# Patient Record
Sex: Female | Born: 1975 | Race: Asian | Hispanic: No | Marital: Married | Smoking: Never smoker
Health system: Southern US, Community
[De-identification: ages and names within clinical notes are randomized; demographics above are authoritative.]

## PROBLEM LIST (undated history)

## (undated) DIAGNOSIS — E785 Hyperlipidemia, unspecified: Secondary | ICD-10-CM

## (undated) DIAGNOSIS — I639 Cerebral infarction, unspecified: Secondary | ICD-10-CM

## (undated) DIAGNOSIS — J309 Allergic rhinitis, unspecified: Secondary | ICD-10-CM

## (undated) DIAGNOSIS — J069 Acute upper respiratory infection, unspecified: Secondary | ICD-10-CM

## (undated) DIAGNOSIS — T7840XA Allergy, unspecified, initial encounter: Secondary | ICD-10-CM

## (undated) DIAGNOSIS — I10 Essential (primary) hypertension: Secondary | ICD-10-CM

## (undated) HISTORY — DX: Acute upper respiratory infection, unspecified: J06.9

## (undated) HISTORY — DX: Allergy, unspecified, initial encounter: T78.40XA

## (undated) HISTORY — PX: TONSILLECTOMY: SUR1361

## (undated) HISTORY — DX: Hyperlipidemia, unspecified: E78.5

## (undated) HISTORY — DX: Essential (primary) hypertension: I10

## (undated) HISTORY — PX: ADENOIDECTOMY: SUR15

## (undated) HISTORY — DX: Allergic rhinitis, unspecified: J30.9

## (undated) HISTORY — DX: Cerebral infarction, unspecified: I63.9

## (undated) HISTORY — PX: BREAST ENHANCEMENT SURGERY: SHX7

---

## 1998-10-26 ENCOUNTER — Other Ambulatory Visit: Admission: RE | Admit: 1998-10-26 | Discharge: 1998-10-26 | Payer: Self-pay | Admitting: Obstetrics

## 1999-05-15 ENCOUNTER — Encounter: Payer: Self-pay | Admitting: *Deleted

## 1999-05-15 ENCOUNTER — Ambulatory Visit (HOSPITAL_COMMUNITY): Admission: RE | Admit: 1999-05-15 | Discharge: 1999-05-15 | Payer: Self-pay | Admitting: *Deleted

## 1999-05-24 ENCOUNTER — Other Ambulatory Visit: Admission: RE | Admit: 1999-05-24 | Discharge: 1999-05-24 | Payer: Self-pay | Admitting: Obstetrics

## 1999-12-26 ENCOUNTER — Inpatient Hospital Stay (HOSPITAL_COMMUNITY): Admission: AD | Admit: 1999-12-26 | Discharge: 1999-12-29 | Payer: Self-pay | Admitting: Obstetrics

## 2000-05-29 ENCOUNTER — Other Ambulatory Visit: Admission: RE | Admit: 2000-05-29 | Discharge: 2000-05-29 | Payer: Self-pay | Admitting: Otolaryngology

## 2003-02-27 ENCOUNTER — Inpatient Hospital Stay (HOSPITAL_COMMUNITY): Admission: AD | Admit: 2003-02-27 | Discharge: 2003-03-01 | Payer: Self-pay | Admitting: Obstetrics

## 2004-10-15 ENCOUNTER — Other Ambulatory Visit: Admission: RE | Admit: 2004-10-15 | Discharge: 2004-10-15 | Payer: Self-pay | Admitting: Obstetrics and Gynecology

## 2014-03-20 ENCOUNTER — Ambulatory Visit (INDEPENDENT_AMBULATORY_CARE_PROVIDER_SITE_OTHER): Payer: 59 | Admitting: Internal Medicine

## 2014-03-20 VITALS — BP 154/92 | HR 76 | Temp 97.9°F | Resp 19 | Ht 60.5 in | Wt 104.6 lb

## 2014-03-20 DIAGNOSIS — J0100 Acute maxillary sinusitis, unspecified: Secondary | ICD-10-CM

## 2014-03-20 DIAGNOSIS — J302 Other seasonal allergic rhinitis: Secondary | ICD-10-CM

## 2014-03-20 MED ORDER — AMOXICILLIN 875 MG PO TABS: 875.0000 mg | ORAL_TABLET | Freq: Two times a day (BID) | ORAL | Status: DC

## 2014-03-20 MED ORDER — MONTELUKAST SODIUM 10 MG PO TABS: 10.0000 mg | ORAL_TABLET | Freq: Every day | ORAL | Status: DC

## 2014-03-20 MED ORDER — FLUTICASONE PROPIONATE 50 MCG/ACT NA SUSP: NASAL | Status: DC

## 2014-03-20 MED ORDER — HYDROCODONE-HOMATROPINE 5-1.5 MG/5ML PO SYRP: 5.0000 mL | ORAL_SOLUTION | Freq: Four times a day (QID) | ORAL | Status: DC | PRN

## 2014-03-20 NOTE — Progress Notes (Signed)
Subjective:  This chart was scribed for Ellamae Siaobert Saber Dickerman, MD by Haywood PaoNadim Abu Hashem, ED Scribe at [facility].The patient was seen in exam room 03 and the patient's care was started at 10:35 AM.   Patient ID: Carol Sanders, female    DOB: 1976/01/11, 39 y.o.   MRN: [id number] Chief Complaint  Patient presents with  . Nasal Congestion    for 3 months, has taken various OTC medications bnut no relief   . Cough    non stop cough, productive, clear phlegm some blood in the am  . Sore Throat   HPI HPI Comments: Carol [employer] is a 39 y.o. female with a history of allergies who [facility] complaining of nasal congestion ongoing for 3 months. She has a cough, sore throat, sinus pressure, rhinorrhea and trouble sleeping as associated symptoms. The sore throat is described as itching. She takes Zyrtec and Claritin, and several other OTC medications for no relief. Pt reports she has winter seasonal allergies every year. She works in a the [employer]. She denies fever and no Hx of asthma.  There are no active problems to display for this patient.  History reviewed. No pertinent past medical history. History reviewed. No pertinent past surgical history. Allergies  Allergen Reactions  . Benadryl [Diphenhydramine Hcl (Sleep)] Cough   Prior to Admission medications   Not on File   History   Social History  . Marital Status: Married    Spouse Name: N/A    Number of Children: N/A  . Years of Education: N/A   Occupational History  . Not on file.   Social History Main Topics  . Smoking status: Never Smoker   . Smokeless tobacco: Never Used  . Alcohol Use: No  . Drug Use: No  . Sexual Activity: Not on file   Other Topics Concern  . Not on file   Social History Narrative  . No narrative on file   Review of Systems  Constitutional: Negative for fever.  HENT: Positive for congestion, rhinorrhea, sinus pressure and sore throat.   Respiratory: Positive for cough.     Allergic/Immunologic: Positive for environmental allergies.  Psychiatric/Behavioral: Positive for sleep disturbance.       Objective:  BP 154/92 mmHg  Pulse 76  Temp(Src) 97.9 F (36.6 C) (Oral)  Resp 19  Ht 5' 0.5" (1.537 m)  Wt 104 lb 9.6 oz (47.446 kg)  BMI 20.08 kg/m2  SpO2 99%  LMP 02/28/2014 (Approximate)  Physical Exam  Constitutional: She is oriented to person, place, and time. She appears well-developed and well-nourished. No distress.  HENT:  Head: Normocephalic and atraumatic.  Right Ear: External ear normal.  Left Ear: External ear normal.  Mouth/Throat: Oropharynx is clear and moist.  She has swollen turbinates with purulent mucous.   Eyes: Pupils are equal, round, and reactive to light.  Injected conjunctiva   Neck: Normal range of motion.  Cardiovascular: Normal rate, regular rhythm and normal heart sounds.   Pulmonary/Chest: Effort normal and breath sounds normal. No respiratory distress.  Musculoskeletal: Normal range of motion.  Lymphadenopathy:    She has no cervical adenopathy.  Neurological: She is alert and oriented to person, place, and time.  Skin: Skin is warm and dry.  Psychiatric: She has a normal mood and affect. Her behavior is normal.  Nursing note and vitals reviewed.      Assessment & Plan:  Other seasonal allergic rhinitis  Acute maxillary sinusitis, recurrence not specified  Meds  ordered this encounter  Medications  . amoxicillin (AMOXIL) 875 MG tablet    Sig: Take 1 tablet (875 mg total) by mouth 2 (two) times daily.    Dispense:  20 tablet    Refill:  0  . montelukast (SINGULAIR) 10 MG tablet    Sig: Take 1 tablet (10 mg total) by mouth at bedtime.    Dispense:  30 tablet    Refill:  11  . fluticasone (FLONASE) 50 MCG/ACT nasal spray    Sig: 1 spray in each nostril twice a day. Remain on this medicine year around    Dispense:  16 g    Refill:  6  . HYDROcodone-homatropine (HYCODAN) 5-1.5 MG/5ML syrup    Sig: Take 5 mLs  by mouth every 6 (six) hours as needed.    Dispense:  120 mL    Refill:  0   Patient Instructions  Your significant allergies have caused a sinus infection which should respond to antibiotics. Cough medicine will help you sleep for the next week. In order to stop your allergies you should continue Zyrtec or Claritin but we will add year-round treatment with Singulair and Flonase nasal spray to stop your problems.       I have completed the patient encounter in its entirety as documented by the scribe, with editing by me where necessary. Layah Skousen P. Merla Riches, M.D.

## 2014-03-20 NOTE — Patient Instructions (Signed)
Your significant allergies have caused a sinus infection which should respond to antibiotics. Cough medicine will help you sleep for the next week. In order to stop your allergies you should continue Zyrtec or Claritin but we will add year-round treatment with Singulair and Flonase nasal spray to stop your problems.

## 2014-04-25 ENCOUNTER — Ambulatory Visit (INDEPENDENT_AMBULATORY_CARE_PROVIDER_SITE_OTHER): Payer: 59 | Admitting: Family Medicine

## 2014-04-25 ENCOUNTER — Ambulatory Visit (INDEPENDENT_AMBULATORY_CARE_PROVIDER_SITE_OTHER): Payer: 59

## 2014-04-25 VITALS — BP 118/80 | HR 102 | Temp 98.3°F | Resp 18 | Wt 106.0 lb

## 2014-04-25 DIAGNOSIS — J309 Allergic rhinitis, unspecified: Secondary | ICD-10-CM

## 2014-04-25 DIAGNOSIS — R05 Cough: Secondary | ICD-10-CM | POA: Diagnosis not present

## 2014-04-25 DIAGNOSIS — R053 Chronic cough: Secondary | ICD-10-CM

## 2014-04-25 LAB — POCT CBC
Granulocyte percent: 63.6 %G (ref 37–80)
HCT, POC: 38.6 % (ref 37.7–47.9)
Hemoglobin: 12 g/dL — AB (ref 12.2–16.2)
Lymph, poc: 1.6 (ref 0.6–3.4)
MCH, POC: 23 pg — AB (ref 27–31.2)
MCHC: 31 g/dL — AB (ref 31.8–35.4)
MCV: 74.3 fL — AB (ref 80–97)
MID (cbc): 0.7 (ref 0–0.9)
MPV: 7.1 fL (ref 0–99.8)
PLATELET COUNT, POC: 308 10*3/uL (ref 142–424)
POC Granulocyte: 3.9 (ref 2–6.9)
POC LYMPH %: 25.6 % (ref 10–50)
POC MID %: 10.8 %M (ref 0–12)
RBC: 5.2 M/uL (ref 4.04–5.48)
RDW, POC: 16.4 %
WBC: 6.1 10*3/uL (ref 4.6–10.2)

## 2014-04-25 MED ORDER — BENZONATATE 100 MG PO CAPS: 100.0000 mg | ORAL_CAPSULE | Freq: Three times a day (TID) | ORAL | Status: DC | PRN

## 2014-04-25 MED ORDER — AZITHROMYCIN 250 MG PO TABS: ORAL_TABLET | ORAL | Status: DC

## 2014-04-25 NOTE — Patient Instructions (Signed)
Take tessalon for cough. Take antibiotic until finished. You will get phone call to make appointment with allergy specialist. Continue to take allergy meds every day - claritin, flonase and singulair.

## 2014-04-25 NOTE — Progress Notes (Signed)
Subjective:    Patient ID: Carol Sanders, female    DOB: 1975-06-17, 39 y.o.   MRN: [id number]  HPI  This is a 39 year old female with PMH allergic rhinitis who is presenting with cough and nasal congestion x 4.5 months. She was seen here 1.5 months ago and diagnosed with sinus infection caused by uncontrolled allergic rhinitis. She was prescribed amoxicillin, hycodan syrup, singulair and flonase. She was instructed to start taking daily clartin. She states since that visit her symptoms have not changed. She continues to cough constantly - cough is productive of white mucous. She has had a few episodes of post-tussive emesis. She continues to have constant nasal congestion. She has been compliant with allergy medications. 2 days ago she started having myalgias, fatigue and subjective fevers. She is endorsing new bilateral ear pain. She denies sore throat, SOB, wheezing. She does not have a history of asthma and is not a smoker. She has never seen an allergist before.  Review of Systems  Constitutional: Positive for fever and fatigue. Negative for chills.  HENT: Positive for congestion, ear pain and sinus pressure. Negative for sore throat.   Eyes: Negative for pain and redness.  Respiratory: Positive for cough. Negative for shortness of breath and wheezing.   Gastrointestinal: Positive for vomiting. Negative for nausea, abdominal pain and diarrhea.  Musculoskeletal: Positive for myalgias.  Skin: Negative for rash.  Allergic/Immunologic: Positive for environmental allergies.  Hematological: Negative for adenopathy.    Patient Active Problem List   Diagnosis Date Noted  . Other seasonal allergic rhinitis 03/20/2014   Prior to Admission medications   Medication Sig Start Date End Date Taking? Authorizing Provider  fluticasone (FLONASE) 50 MCG/ACT nasal spray 1 spray in each nostril twice a day. Remain on this medicine year around 03/20/14  Yes Tonye Pearsonobert P Doolittle, MD  montelukast (SINGULAIR) 10  MG tablet Take 1 tablet (10 mg total) by mouth at bedtime. 03/20/14  Yes Tonye Pearsonobert P Doolittle, MD   Allergies  Allergen Reactions  . Benadryl [Diphenhydramine Hcl (Sleep)] Cough   Patient's social and family history were reviewed.     Objective:   Physical Exam  Constitutional: She is oriented to person, place, and time. She appears well-developed and well-nourished. No distress.  HENT:  Head: Normocephalic and atraumatic.  Right Ear: Hearing, tympanic membrane, external ear and ear canal normal.  Left Ear: Hearing, tympanic membrane, external ear and ear canal normal.  Nose: Right sinus exhibits no maxillary sinus tenderness and no frontal sinus tenderness. Left sinus exhibits no maxillary sinus tenderness and no frontal sinus tenderness.  Mouth/Throat: Uvula is midline and mucous membranes are normal. Posterior oropharyngeal erythema present. No oropharyngeal exudate or posterior oropharyngeal edema.  Pale mucosa  Eyes: Conjunctivae and lids are normal. Right eye exhibits no discharge. Left eye exhibits no discharge. No scleral icterus.  Cardiovascular: Normal rate, regular rhythm, normal heart sounds, intact distal pulses and normal pulses.   No murmur heard. Pulmonary/Chest: Effort normal and breath sounds normal. No respiratory distress. She has no wheezes. She has no rhonchi. She has no rales.  Abdominal: Soft. Normal appearance. There is no tenderness.  Musculoskeletal: Normal range of motion.  Lymphadenopathy:    She has cervical adenopathy (bilateral anterior, shotty).  Neurological: She is alert and oriented to person, place, and time.  Skin: Skin is warm, dry and intact. No lesion and no rash noted.  Psychiatric: She has a normal mood and affect. Her speech is normal and behavior is normal.  Thought content normal.   BP 118/80 mmHg  Pulse 102  Temp(Src) 98.3 F (36.8 C) (Oral)  Resp 18  Wt 106 lb (48.081 kg)  SpO2 94%  LMP 03/23/2014  [facility] reading (PRIMARY) by  Dr.  Clelia Croft: possible right hilar consolidation  Results for orders placed or performed in visit on 04/25/14  POCT CBC  Result Value Ref Range   WBC 6.1 4.6 - 10.2 K/uL   Lymph, poc 1.6 0.6 - 3.4   POC LYMPH PERCENT 25.6 10 - 50 %L   MID (cbc) 0.7 0 - 0.9   POC MID % 10.8 0 - 12 %M   POC Granulocyte 3.9 2 - 6.9   Granulocyte percent 63.6 37 - 80 %G   RBC 5.20 4.04 - 5.48 M/uL   Hemoglobin 12.0 (A) 12.2 - 16.2 g/dL   HCT, POC 16.1 09.6 - 47.9 %   MCV 74.3 (A) 80 - 97 fL   MCH, POC 23.0 (A) 27 - 31.2 pg   MCHC 31.0 (A) 31.8 - 35.4 g/dL   RDW, POC 04.5 %   Platelet Count, POC 308 142 - 424 K/uL   MPV 7.1 0 - 99.8 fL       Assessment & Plan:  1. Persistent cough 2. Allergic rhinitis, unspecified Radiograph negative, cbc wnl. Will treat with azithromycin d/t ongoing symptoms with new myalgias and malaise. She will continue with claritin, flonase and singulair. She was referred to an allergist for further evaluation of uncontrolled allergies.  - DG Chest 2 View; Future - POCT CBC - azithromycin (ZITHROMAX) 250 MG tablet; Take 2 tabs PO x 1 dose, then 1 tab PO QD x 4 days  Dispense: 6 tablet; Refill: 0 - benzonatate (TESSALON) 100 MG capsule; Take 1-2 capsules (100-200 mg total) by mouth 3 (three) times daily as needed for cough.  Dispense: 40 capsule; Refill: 0 - Ambulatory referral to Allergy   Roswell Miners. Dyke Brackett, MHS [facility]  04/26/2014

## 2014-04-28 ENCOUNTER — Other Ambulatory Visit: Payer: Self-pay | Admitting: Physician Assistant

## 2014-05-10 NOTE — Progress Notes (Signed)
Reviewed documentation and xray and agree w/ assessment and plan. Norberto SorensonEva Shaw, MD MPH  Dg Chest 2 View  04/25/2014   CLINICAL DATA:  Cough for 1 and half months  EXAM: CHEST  2 VIEW  COMPARISON:  None.  FINDINGS: The heart size and mediastinal contours are within normal limits. There is no focal infiltrate, pulmonary edema, or pleural effusion. Bilateral symmetrical nipple shadows are noted. There is scoliosis of spine.  IMPRESSION: No active cardiopulmonary disease.   Electronically Signed   By: Sherian ReinWei-Chen  Lin M.D.   On: 04/25/2014 20:48

## 2016-04-17 ENCOUNTER — Ambulatory Visit (INDEPENDENT_AMBULATORY_CARE_PROVIDER_SITE_OTHER): Payer: BLUE CROSS/BLUE SHIELD | Admitting: Allergy and Immunology

## 2016-04-17 ENCOUNTER — Encounter (INDEPENDENT_AMBULATORY_CARE_PROVIDER_SITE_OTHER): Payer: Self-pay

## 2016-04-17 ENCOUNTER — Encounter: Payer: Self-pay | Admitting: Allergy and Immunology

## 2016-04-17 VITALS — BP 136/88 | HR 86 | Temp 98.2°F | Resp 16 | Ht 60.0 in | Wt 110.0 lb

## 2016-04-17 DIAGNOSIS — H1010 Acute atopic conjunctivitis, unspecified eye: Secondary | ICD-10-CM | POA: Diagnosis not present

## 2016-04-17 DIAGNOSIS — J309 Allergic rhinitis, unspecified: Secondary | ICD-10-CM

## 2016-04-17 DIAGNOSIS — K219 Gastro-esophageal reflux disease without esophagitis: Secondary | ICD-10-CM | POA: Diagnosis not present

## 2016-04-17 LAB — PULMONARY FUNCTION TEST

## 2016-04-17 MED ORDER — MOMETASONE FUROATE 0.1 % EX OINT: TOPICAL_OINTMENT | CUTANEOUS | 3 refills | Status: DC

## 2016-04-17 MED ORDER — METHYLPREDNISOLONE ACETATE 80 MG/ML IJ SUSP
80.0000 mg | Freq: Once | INTRAMUSCULAR | Status: AC
  Administered 2016-04-17: 80 mg via INTRAMUSCULAR

## 2016-04-17 MED ORDER — LOSARTAN POTASSIUM 50 MG PO TABS: 50.0000 mg | ORAL_TABLET | Freq: Every day | ORAL | 1 refills | Status: DC

## 2016-04-17 MED ORDER — RANITIDINE HCL 150 MG PO TABS: 300.0000 mg | ORAL_TABLET | Freq: Every day | ORAL | 1 refills | Status: DC

## 2016-04-17 NOTE — Patient Instructions (Addendum)
  1. Allergen avoidance measures  2. Treat and prevent inflammation:   A. OTC Rhinocort/Nasacort one spray each nostril one time per day  B. montelukast 10 mg tablet 1 time per day  C. Depo-Medrol 80 IM delivered in clinic today  3.  Treat and prevent reflux:   A. OTC Ranitidine 150 - 2 tablets one time per day  4.  Treat hypertension:   A. change lisinopril to losartan 50 one tablet one time per day  B. check blood pressure  5.  If needed:   A. Cetirizine/Zyrtec one tablet one-2 times per day  B. nasal saline   C. mometasone 0.1% ointment applied to hands one time per day  6. Immunotherapy?  7. Return to clinic in 4 weeks or earlier if problem

## 2016-04-17 NOTE — Progress Notes (Signed)
Dear Dr. Egbert Garibaldi,  Thank you for referring Carol Sanders to the [facility] [place] on 04/17/2016.   Below is a summation of this patient's evaluation and recommendations.  Thank you for your referral. I will keep you informed about this patient's response to treatment.   If you have any questions please do not hesitate to contact me.   Sincerely,  Jessica Priest, MD Allergy / Immunology [facility]   ______________________________________________________________________    NEW PATIENT NOTE  Referring Provider: Nonnie Done., MD Primary Provider: Nonnie Done., MD Date of office visit: 04/17/2016    Subjective:   Chief Complaint:  Carol Sanders (DOB: January 26, 1976) is a 41 y.o. female who presents to the clinic on 04/17/2016 with a chief complaint of Allergic Rhinitis  (enovironmental. year round. itchy throat/eyes. coughing. no antihistamines x3 days. ) .     HPI: Taunia presents to this clinic in evaluation of persistent respiratory tract symptoms that have been present for approximately 10 years.  She complains of having runny nose and sneezing and occasional itchy eyes. She also has constant postnasal drip and throat clearing and itchy throat and raspy voice and coughing. She will regurgitate sometimes with some of her coughing episodes. She will develop a headache sometimes when she coughs excessively. She does not have any associated anosmia or ugly nasal discharge. There is no obvious provoking factor giving rise to these symptoms. They appear to occur on a perennial basis but are somewhat worse when the summertime season is present.   Her coughing has really gotten out of hand the past several months. She complains of having this chest pressure in her sternal region on occasion with her coughing. The coughing is disturbing her sleep.  She is using lisinopril to treat hypertension.  She  has seen Dr. Ezzard Standing on several occasions many years ago and apparently her upper airway checks out okay. She does not know if her throat was evaluated.  She has also developed problems with her hands. They become somewhat itchy over the course of the past several years.  Past Medical History:  Diagnosis Date  . Allergic rhinitis   . Recurrent upper respiratory infection (URI)     Past Surgical History:  Procedure Laterality Date  . ADENOIDECTOMY    . TONSILLECTOMY      Allergies as of 04/17/2016      Reactions   Benadryl [diphenhydramine Hcl (sleep)] Cough      Medication List      ALKA-SELTZER PLUS COLD PO Take 1 packet by mouth daily as needed.   fluticasone 50 MCG/ACT nasal spray Commonly known as:  FLONASE 1 spray in each nostril twice a day. Remain on this medicine year around   lisinopril 10 MG tablet Commonly known as:  PRINIVIL,ZESTRIL TK 1 T PO ONCE A DAY   loratadine 10 MG tablet Commonly known as:  CLARITIN Take 10 mg by mouth daily as needed for allergies.   loratadine-pseudoephedrine 10-240 MG 24 hr tablet Commonly known as:  CLARITIN-D 24-hour Take 1 tablet by mouth daily.   montelukast 10 MG tablet Commonly known as:  SINGULAIR Take 1 tablet (10 mg total) by mouth at bedtime.   PENICILLIN V POTASSIUM PO Take 1 tablet by mouth. Takes prn for excess nasal drainage.   phenylephrine 10 MG Tabs tablet Commonly known as:  SUDAFED PE Take 10 mg by mouth as needed.  Review of systems negative except as noted in HPI / PMHx or noted below:  Review of Systems  Constitutional: Negative.   HENT: Negative.   Eyes: Negative.   Respiratory: Negative.   Cardiovascular: Negative.   Gastrointestinal: Negative.   Genitourinary: Negative.   Musculoskeletal: Negative.   Skin: Negative.   Neurological: Negative.   Endo/Heme/Allergies: Negative.   Psychiatric/Behavioral: Negative.     No family history on file.  Social History   Social History    . Marital status: Married    Spouse name: N/A  . Number of children: N/A  . Years of education: N/A   Occupational History  . Not on file.   Social History Main Topics  . Smoking status: Never Smoker  . Smokeless tobacco: Never Used  . Alcohol use No  . Drug use: No  . Sexual activity: Not on file   Other Topics Concern  . Not on file   Social History Narrative  . No narrative on file    Environmental and Social history  Lives in a house with a dry environment, no animals located inside the household, hardwood in the bedroom, no plastic on the bed or pillow, and no smoking ongoing with inside the household. She works as a Advertising account planner.  Objective:   Vitals:   04/17/16 0915  BP: 136/88  Pulse: 86  Resp: 16  Temp: 98.2 F (36.8 C)   Height: 5' (152.4 cm) Weight: 110 lb (49.9 kg)  Physical Exam  Constitutional: She is well-developed, well-nourished, and in no distress.  Cough, throat clearing, raspy voice  HENT:  Head: Normocephalic. Head is without right periorbital erythema and without left periorbital erythema.  Right Ear: Tympanic membrane, external ear and ear canal normal.  Left Ear: Tympanic membrane, external ear and ear canal normal.  Nose: Mucosal edema and rhinorrhea (clear) present.  Mouth/Throat: Uvula is midline, oropharynx is clear and moist and mucous membranes are normal. No oropharyngeal exudate.  Eyes: Conjunctivae and lids are normal. Pupils are equal, round, and reactive to light.  Neck: Trachea normal. No tracheal tenderness present. No tracheal deviation present. No thyromegaly present.  Cardiovascular: Normal rate, regular rhythm, S1 normal, S2 normal and normal heart sounds.   No murmur heard. Pulmonary/Chest: Effort normal and breath sounds normal. No stridor. No tachypnea. No respiratory distress. She has no wheezes. She has no rales. She exhibits no tenderness.  Abdominal: Soft. She exhibits no distension and no mass. There is no  hepatosplenomegaly. There is no tenderness. There is no rebound and no guarding.  Musculoskeletal: She exhibits no edema or tenderness.  Lymphadenopathy:       Head (right side): No tonsillar adenopathy present.       Head (left side): No tonsillar adenopathy present.    She has no cervical adenopathy.    She has no axillary adenopathy.  Neurological: She is alert. Gait normal.  Skin: No rash noted. She is not diaphoretic. No erythema. No pallor. Nails show no clubbing.  Psychiatric: Mood and affect normal.    Diagnostics: Allergy skin tests were performed. She demonstrated hypersensitivity against grasses, weeds, trees, cat, and cockroach with very slight hypersensitivity directed against dog.  Spirometry was performed and demonstrated an FEV1 of 1.89 @ 79 % of predicted.  Assessment and Plan:    1. Allergic rhinoconjunctivitis   2. Other allergic rhinitis   3. Gastroesophageal reflux disease, esophagitis presence not specified     1. Allergen avoidance measures  2. Treat and prevent inflammation:  A. OTC Rhinocort/Nasacort one spray each nostril one time per day  B. montelukast 10 mg tablet 1 time per day  C. Depo-Medrol 80 IM delivered in clinic today  3.  Treat and prevent reflux:   A. OTC Rantidine 150 - 2 tablets one time per day  4.  Treat hypertension:   A. change lisinopril to losartan 50 one tablet one time per day  B. check blood pressure  5.  If needed:   A. Cetirizine/Zyrtec one tablet one-2 times per day  B. nasal saline   C. mometasone 0.1% ointment applied to hands one time per day  6. Immunotherapy?  7. Return to clinic in 4 weeks or earlier if problem  [name] appears to have an inflamed and irritated respiratory tract and we will address the atopic component with anti-inflammatory therapy for her respiratory tract as noted above and remove her lisinopril as she does have pretty consistent cough which may be secondary to the use of this ACE inhibitor  and also address the issue with possible reflux-induced cough especially given the fact that she does have what appears to be posttussive emesis. I will regroup with her in 4 weeks to assess her response to therapy and make a determination about further evaluation and treatment based upon this response.  Jessica PriestEric J. Cline Draheim, MD [facility] [place]

## 2016-04-18 ENCOUNTER — Encounter: Payer: Self-pay | Admitting: Allergy and Immunology

## 2016-04-19 ENCOUNTER — Other Ambulatory Visit: Payer: Self-pay

## 2016-04-19 MED ORDER — MONTELUKAST SODIUM 10 MG PO TABS: 10.0000 mg | ORAL_TABLET | Freq: Every day | ORAL | 11 refills | Status: DC

## 2016-05-02 ENCOUNTER — Encounter: Payer: Self-pay | Admitting: Allergy and Immunology

## 2016-05-15 ENCOUNTER — Ambulatory Visit (INDEPENDENT_AMBULATORY_CARE_PROVIDER_SITE_OTHER): Payer: BLUE CROSS/BLUE SHIELD | Admitting: Allergy and Immunology

## 2016-05-15 ENCOUNTER — Encounter: Payer: Self-pay | Admitting: Allergy and Immunology

## 2016-05-15 ENCOUNTER — Ambulatory Visit
Admission: RE | Admit: 2016-05-15 | Discharge: 2016-05-15 | Disposition: A | Discharge status: Home / Self Care | Payer: BLUE CROSS/BLUE SHIELD | Source: Ambulatory Visit | Attending: Allergy and Immunology | Admitting: Allergy and Immunology

## 2016-05-15 VITALS — BP 130/90 | HR 60 | Resp 18

## 2016-05-15 DIAGNOSIS — R05 Cough: Secondary | ICD-10-CM | POA: Diagnosis not present

## 2016-05-15 DIAGNOSIS — R059 Cough, unspecified: Secondary | ICD-10-CM

## 2016-05-15 DIAGNOSIS — J309 Allergic rhinitis, unspecified: Secondary | ICD-10-CM

## 2016-05-15 DIAGNOSIS — H1010 Acute atopic conjunctivitis, unspecified eye: Secondary | ICD-10-CM | POA: Diagnosis not present

## 2016-05-15 DIAGNOSIS — K219 Gastro-esophageal reflux disease without esophagitis: Secondary | ICD-10-CM

## 2016-05-15 LAB — PULMONARY FUNCTION TEST

## 2016-05-15 MED ORDER — RANITIDINE HCL 150 MG PO TABS: ORAL_TABLET | ORAL | 5 refills | Status: DC

## 2016-05-15 MED ORDER — OMEPRAZOLE 40 MG PO CPDR: DELAYED_RELEASE_CAPSULE | ORAL | 5 refills | Status: DC

## 2016-05-15 NOTE — Progress Notes (Signed)
Follow-up Note  Referring Provider: Nonnie Done., MD Primary Provider: Nonnie Done., MD Date of Office Visit: 05/15/2016  Subjective:   Carol Sanders (DOB: [birth year]) is a 41 y.o. female who [facility] in re-evaluation of the following:  HPI: Carol Sanders returns to this clinic in reevaluation of her respiratory tract symptoms addressed during her initial evaluation of 04/17/2016.  She is much better regarding her cough. This is the first time in a prolonged period of time that she has resolved her cough. However, she still wakes up in the morning and has lots of throat clearing and some raspy voice. She has had very little problems with her nose at this point in time. She is no longer using her lisinopril. She continues to use ranitidine and Nasacort and montelukast and losartan.  Her hands are doing well. She's been using mometasone intermittently.  Allergies as of 05/15/2016      Reactions   Benadryl [diphenhydramine Hcl (sleep)] Cough      Medication List      ALKA-SELTZER PLUS COLD PO Take 1 packet by mouth daily as needed.   fluticasone 50 MCG/ACT nasal spray Commonly known as:  FLONASE 1 spray in each nostril twice a day. Remain on this medicine year around   loratadine 10 MG tablet Commonly known as:  CLARITIN Take 10 mg by mouth daily as needed for allergies.   loratadine-pseudoephedrine 10-240 MG 24 hr tablet Commonly known as:  CLARITIN-D 24-hour Take 1 tablet by mouth daily.   losartan 50 MG tablet Commonly known as:  COZAAR Take 1 tablet (50 mg total) by mouth daily.   mometasone 0.1 % ointment Commonly known as:  ELOCON Apply to hands once daily as needed.   montelukast 10 MG tablet Commonly known as:  SINGULAIR Take 1 tablet (10 mg total) by mouth at bedtime.   PENICILLIN V POTASSIUM PO Take 1 tablet by mouth. Takes prn for excess nasal drainage.   phenylephrine 10 MG Tabs tablet Commonly known as:   SUDAFED PE Take 10 mg by mouth as needed.   ranitidine 150 MG tablet Commonly known as:  ZANTAC Take 2 tablets (300 mg total) by mouth daily.       Past Medical History:  Diagnosis Date  . Allergic rhinitis   . Recurrent upper respiratory infection (URI)     Past Surgical History:  Procedure Laterality Date  . ADENOIDECTOMY    . TONSILLECTOMY      Review of systems negative except as noted in HPI / PMHx or noted below:  Review of Systems  Constitutional: Negative.   HENT: Negative.   Eyes: Negative.   Respiratory: Negative.   Cardiovascular: Negative.   Gastrointestinal: Negative.   Genitourinary: Negative.   Musculoskeletal: Negative.   Skin: Negative.   Neurological: Negative.   Endo/Heme/Allergies: Negative.   Psychiatric/Behavioral: Negative.      Objective:   Vitals:   05/15/16 1004  BP: 130/90  Pulse: 60  Resp: 18          Physical Exam  Constitutional: She is well-developed, well-nourished, and in no distress.  Throat clearing  HENT:  Head: Normocephalic.  Right Ear: Tympanic membrane, external ear and ear canal normal.  Left Ear: Tympanic membrane, external ear and ear canal normal.  Nose: Nose normal. No mucosal edema or rhinorrhea.  Mouth/Throat: Uvula is midline, oropharynx is clear and moist and mucous membranes are normal. No oropharyngeal exudate.  Eyes: Conjunctivae are  normal.  Neck: Trachea normal. No tracheal tenderness present. No tracheal deviation present. No thyromegaly present.  Cardiovascular: Normal rate, regular rhythm, S1 normal, S2 normal and normal heart sounds.   No murmur heard. Pulmonary/Chest: Breath sounds normal. No stridor. No respiratory distress. She has no wheezes. She has no rales.  Musculoskeletal: She exhibits no edema.  Lymphadenopathy:       Head (right side): No tonsillar adenopathy present.       Head (left side): No tonsillar adenopathy present.    She has no cervical adenopathy.  Neurological: She  is alert. Gait normal.  Skin: No rash noted. She is not diaphoretic. No erythema. Nails show no clubbing.  Psychiatric: Mood and affect normal.    Diagnostics:    Spirometry was performed and demonstrated an FEV1 of 1.92 at 79 % of predicted.   Assessment and Plan:   1. Allergic rhinoconjunctivitis   2. Gastroesophageal reflux disease, esophagitis presence not specified   3. Cough     1. Continue to perform Allergen avoidance measures  2. Continue to Treat and prevent inflammation:   A. OTC Rhinocort/Nasacort one spray each nostril one time per day  B. montelukast 10 mg tablet 1 time per day  3.  Continue to Treat and prevent reflux:   A. Continue OTC Ranitidine 150 - 2 tablets one time in PM  B. Start Omeprazole 40mg  in AM  4.  Continue to Treat hypertension:   A. losartan 50 one tablet one time per day  B. check blood pressure  5.  If needed:   A. Cetirizine/Zyrtec one tablet one-2 times per day  B. nasal saline   C. mometasone 0.1% ointment applied to hands one time per day  6. Obtain a Chest X-ray  7. Return to clinic in 8 weeks or earlier if problem  It is good that Carol Sanders has resolved her unrelenting coughing episodes but she still continues to have lots of throat clearing and a little bit of morning cough and I will assume that this may be secondary to LPR and treat her with the addition of omeprazole to her ranitidine at this point and I'm also going to obtain a chest x-ray to make sure that we are not dealing with any other issue involving her mediastinum or lung parenchyma contributing to cough. She has already seen an ENT doctor, Dr. Ezzard StandingNewman, in the past regarding some of her complaints. She will continue to treat her atopic respiratory disease with anti-inflammatory medications for her respiratory tract as noted above. We'll see how things go over the course of the next 8 weeks.  Laurette SchimkeEric Kozlow, MD Allergy / Immunology [facility]

## 2016-05-15 NOTE — Patient Instructions (Addendum)
  1. Continue to perform Allergen avoidance measures  2. Continue to Treat and prevent inflammation:   A. OTC Rhinocort/Nasacort one spray each nostril one time per day  B. montelukast 10 mg tablet 1 time per day  3.  Continue to Treat and prevent reflux:   A. Continue OTC Ranitidine 150 - 2 tablets one time in PM  B. Start Omeprazole 40mg  in AM  4.  Continue to Treat hypertension:   A. losartan 50 one tablet one time per day  B. check blood pressure  5.  If needed:   A. Cetirizine/Zyrtec one tablet one-2 times per day  B. nasal saline   C. mometasone 0.1% ointment applied to hands one time per day  6. Obtain a Chest X-ray  7. Return to clinic in 8 weeks or earlier if problem

## 2016-06-12 ENCOUNTER — Encounter: Payer: Self-pay | Admitting: Allergy and Immunology

## 2016-06-29 ENCOUNTER — Other Ambulatory Visit: Payer: Self-pay | Admitting: Allergy and Immunology

## 2016-07-10 ENCOUNTER — Encounter (INDEPENDENT_AMBULATORY_CARE_PROVIDER_SITE_OTHER): Payer: Self-pay

## 2016-07-10 ENCOUNTER — Ambulatory Visit (INDEPENDENT_AMBULATORY_CARE_PROVIDER_SITE_OTHER): Payer: BLUE CROSS/BLUE SHIELD | Admitting: Allergy and Immunology

## 2016-07-10 ENCOUNTER — Encounter: Payer: Self-pay | Admitting: Allergy and Immunology

## 2016-07-10 VITALS — BP 130/82 | HR 85 | Temp 98.2°F | Resp 18

## 2016-07-10 DIAGNOSIS — K219 Gastro-esophageal reflux disease without esophagitis: Secondary | ICD-10-CM

## 2016-07-10 DIAGNOSIS — H1045 Other chronic allergic conjunctivitis: Secondary | ICD-10-CM

## 2016-07-10 DIAGNOSIS — L2089 Other atopic dermatitis: Secondary | ICD-10-CM | POA: Diagnosis not present

## 2016-07-10 DIAGNOSIS — H1010 Acute atopic conjunctivitis, unspecified eye: Secondary | ICD-10-CM

## 2016-07-10 DIAGNOSIS — J3089 Other allergic rhinitis: Secondary | ICD-10-CM | POA: Diagnosis not present

## 2016-07-10 DIAGNOSIS — T464X5A Adverse effect of angiotensin-converting-enzyme inhibitors, initial encounter: Secondary | ICD-10-CM

## 2016-07-10 DIAGNOSIS — R059 Cough, unspecified: Secondary | ICD-10-CM

## 2016-07-10 DIAGNOSIS — R011 Cardiac murmur, unspecified: Secondary | ICD-10-CM

## 2016-07-10 DIAGNOSIS — R05 Cough: Secondary | ICD-10-CM

## 2016-07-10 NOTE — Progress Notes (Signed)
Follow-up Note  Referring Provider: Nonnie DoneSlatosky, John J., MD Primary Provider: Nonnie DoneSlatosky, John J., MD Date of Office Visit: 07/10/2016  Subjective:   Carol Sanders (DOB: [birth year]) is a 41 y.o. female who [facility]2016 in re-evaluation of the following:  HPI: [name] presents to this clinic in evaluation of cough, throat clearing, intermittent raspy voice, nasal congestion and sneezing associated with her allergic rhinitis, itchy red watery eyes associated with her seasonal allergic conjunctivitis and intermittent atopic dermatitis. I last saw her in this clinic on 05/15/2016 at which point in time we attempted to address all of her issues.  Since the pollen has abated she has had excellent control of her nasal and eye issues. She has stopped her montelukast and her nasal steroid.  She does not have cough anymore since she discontinued her lisinopril and used therapy directed against reflux. Her throat is doing better though sometimes she has early morning raspy voice.  She no longer has any chest pain. She has not had any reflux.  The dermatitis on her hand is under excellent control and she no longer uses any topical steroid.  Allergies as of 07/10/2016      Reactions   Benadryl [diphenhydramine Hcl (sleep)] Cough      Medication List      cetirizine 10 MG tablet Commonly known as:  ZYRTEC Take 10 mg by mouth daily.   fluticasone 50 MCG/ACT nasal spray Commonly known as:  FLONASE 1 spray in each nostril twice a day. Remain on this medicine year around   losartan 50 MG tablet Commonly known as:  COZAAR TAKE 1 TABLET(50 MG) BY MOUTH DAILY   montelukast 10 MG tablet Commonly known as:  SINGULAIR Take 1 tablet (10 mg total) by mouth at bedtime.   omeprazole 40 MG capsule Commonly known as:  PRILOSEC Take one capsule every morning before breakfast   PENICILLIN V POTASSIUM PO Take 1 tablet by mouth. Takes prn for excess nasal  drainage.   phenylephrine 10 MG Tabs tablet Commonly known as:  SUDAFED PE Take 10 mg by mouth daily as needed.   ranitidine 150 MG tablet Commonly known as:  ZANTAC Take two tablets at bedtime       Past Medical History:  Diagnosis Date  . Allergic rhinitis   . Recurrent upper respiratory infection (URI)     Past Surgical History:  Procedure Laterality Date  . ADENOIDECTOMY    . TONSILLECTOMY      Review of systems negative except as noted in HPI / PMHx or noted below:  Review of Systems  Constitutional: Negative.   HENT: Negative.   Eyes: Negative.   Respiratory: Negative.   Cardiovascular: Negative.   Gastrointestinal: Negative.   Genitourinary: Negative.   Musculoskeletal: Negative.   Skin: Negative.   Neurological: Negative.   Endo/Heme/Allergies: Negative.   Psychiatric/Behavioral: Negative.      Objective:   Vitals:   07/10/16 1012  BP: 130/82  Pulse: 85  Resp: 18  Temp: 98.2 F (36.8 C)          Physical Exam  Constitutional: She is well-developed, well-nourished, and in no distress.  HENT:  Head: Normocephalic.  Right Ear: External ear and ear canal normal. Tympanic membrane is perforated.  Left Ear: Tympanic membrane, external ear and ear canal normal.  Nose: Mucosal edema present. No rhinorrhea.  Mouth/Throat: Uvula is midline, oropharynx is clear and moist and mucous membranes are normal. No oropharyngeal exudate.  Eyes: Conjunctivae are normal.  Neck: Trachea normal. No tracheal tenderness present. No tracheal deviation present. No thyromegaly present.  Cardiovascular: Normal rate, regular rhythm, S1 normal and S2 normal.   Murmur (Systolic) heard. Pulmonary/Chest: Breath sounds normal. No stridor. No respiratory distress. She has no wheezes. She has no rales.  Musculoskeletal: She exhibits no edema.  Lymphadenopathy:       Head (right side): No tonsillar adenopathy present.       Head (left side): No tonsillar adenopathy present.      She has no cervical adenopathy.  Neurological: She is alert. Gait normal.  Skin: No rash noted. She is not diaphoretic. No erythema. Nails show no clubbing.  Psychiatric: Mood and affect normal.    Diagnostics: Results of a chest x-ray obtained 05/15/2016 identify the following:  Mediastinum and hilar structures normal. Lungs are clear. No pleural effusion or pneumothorax. Heart size normal.  Assessment and Plan:   1. Other allergic rhinitis   2. Seasonal allergic conjunctivitis   3. Gastroesophageal reflux disease, esophagitis presence not specified   4. Other atopic dermatitis   5. Cough   6. Murmur   7. Cough due to ACE inhibitor     1. Continue to perform Allergen avoidance measures  2. Continue to Treat and prevent inflammation:   A. OTC Nasacort one spray each nostril one time per day during Spring  B. montelukast 10 mg tablet 1 time per day during Spring  3.  Continue to Treat and prevent reflux:   A. OTC Ranitidine 150 - 2 tablets one time in PM  B. Omeprazole 40mg  in AM  4.  Continue to Treat hypertension:   A. losartan 50 one tablet one time per day  B. check blood pressure  5.  If needed:   A. Cetirizine/Zyrtec one tablet one-2 times per day  B. nasal saline   C. mometasone 0.1% ointment applied to hands one time per day  6. Obtain a 2D - Echo for murmur  7. Return to clinic in September 2018 or earlier if problem  Prerana appears to be doing better at this point especially since the pollen has abated. She can always restart her nasal steroid and her leukotriene modifier if she develop significant problems with her eyes and nose as she moves forward. I did recommend that she restart these medications at next Valentine's day in anticipation of her springtime flare. She will continue to treat reflux and LPR with a combination of a H2 receptor blocker and a proton pump inhibitor. She will continue to remain away from ACE inhibitor's by using her losartan. She  does have a murmur and we will evaluate that with a echo. I will see her back in this clinic in September 2018 or earlier if there is a problem.  Laurette Schimke, MD Allergy / Immunology [facility]

## 2016-07-10 NOTE — Patient Instructions (Addendum)
  1. Continue to perform Allergen avoidance measures  2. Continue to Treat and prevent inflammation:   A. OTC Nasacort one spray each nostril one time per day during Spring  B. montelukast 10 mg tablet 1 time per day during Spring  3.  Continue to Treat and prevent reflux:   A. OTC Ranitidine 150 - 2 tablets one time in PM  B. Omeprazole 40mg  in AM  4.  Continue to Treat hypertension:   A. losartan 50 one tablet one time per day  B. check blood pressure  5.  If needed:   A. Cetirizine/Zyrtec one tablet one-2 times per day  B. nasal saline   C. mometasone 0.1% ointment applied to hands one time per day  6. Obtain a 2D - Echo for murmur  7. Return to clinic in September 2018 or earlier if problem

## 2016-07-24 ENCOUNTER — Ambulatory Visit (HOSPITAL_COMMUNITY): Payer: BLUE CROSS/BLUE SHIELD | Attending: Cardiology

## 2016-07-24 ENCOUNTER — Other Ambulatory Visit: Payer: Self-pay

## 2016-07-24 ENCOUNTER — Other Ambulatory Visit: Payer: Self-pay | Admitting: Allergy and Immunology

## 2016-07-24 DIAGNOSIS — R011 Cardiac murmur, unspecified: Secondary | ICD-10-CM | POA: Diagnosis present

## 2016-10-22 ENCOUNTER — Ambulatory Visit: Payer: BLUE CROSS/BLUE SHIELD | Admitting: Allergy and Immunology

## 2016-10-22 DIAGNOSIS — J309 Allergic rhinitis, unspecified: Secondary | ICD-10-CM

## 2016-11-08 ENCOUNTER — Other Ambulatory Visit: Payer: Self-pay | Admitting: Allergy and Immunology

## 2016-12-17 ENCOUNTER — Other Ambulatory Visit: Payer: Self-pay | Admitting: Allergy and Immunology

## 2018-02-12 ENCOUNTER — Emergency Department (HOSPITAL_COMMUNITY): Payer: BLUE CROSS/BLUE SHIELD

## 2018-02-12 ENCOUNTER — Inpatient Hospital Stay (HOSPITAL_COMMUNITY)
Admission: EM | Admit: 2018-02-12 | Discharge: 2018-02-14 | DRG: 066 | Disposition: A | Discharge status: Home / Self Care | Payer: BLUE CROSS/BLUE SHIELD | Attending: Family Medicine | Admitting: Family Medicine

## 2018-02-12 ENCOUNTER — Other Ambulatory Visit: Payer: Self-pay

## 2018-02-12 DIAGNOSIS — R202 Paresthesia of skin: Secondary | ICD-10-CM

## 2018-02-12 DIAGNOSIS — H538 Other visual disturbances: Secondary | ICD-10-CM | POA: Diagnosis present

## 2018-02-12 DIAGNOSIS — D649 Anemia, unspecified: Secondary | ICD-10-CM

## 2018-02-12 DIAGNOSIS — R29700 NIHSS score 0: Secondary | ICD-10-CM | POA: Diagnosis present

## 2018-02-12 DIAGNOSIS — J309 Allergic rhinitis, unspecified: Secondary | ICD-10-CM | POA: Diagnosis present

## 2018-02-12 DIAGNOSIS — Z91138 Patient's unintentional underdosing of medication regimen for other reason: Secondary | ICD-10-CM

## 2018-02-12 DIAGNOSIS — I10 Essential (primary) hypertension: Secondary | ICD-10-CM | POA: Diagnosis present

## 2018-02-12 DIAGNOSIS — D509 Iron deficiency anemia, unspecified: Secondary | ICD-10-CM | POA: Diagnosis present

## 2018-02-12 DIAGNOSIS — I639 Cerebral infarction, unspecified: Secondary | ICD-10-CM | POA: Diagnosis not present

## 2018-02-12 DIAGNOSIS — R200 Anesthesia of skin: Secondary | ICD-10-CM

## 2018-02-12 DIAGNOSIS — E876 Hypokalemia: Secondary | ICD-10-CM | POA: Diagnosis present

## 2018-02-12 DIAGNOSIS — Z888 Allergy status to other drugs, medicaments and biological substances status: Secondary | ICD-10-CM

## 2018-02-12 DIAGNOSIS — E785 Hyperlipidemia, unspecified: Secondary | ICD-10-CM | POA: Diagnosis present

## 2018-02-12 DIAGNOSIS — Z79899 Other long term (current) drug therapy: Secondary | ICD-10-CM

## 2018-02-12 DIAGNOSIS — Z823 Family history of stroke: Secondary | ICD-10-CM

## 2018-02-12 DIAGNOSIS — R9082 White matter disease, unspecified: Secondary | ICD-10-CM | POA: Diagnosis present

## 2018-02-12 DIAGNOSIS — T465X6A Underdosing of other antihypertensive drugs, initial encounter: Secondary | ICD-10-CM | POA: Diagnosis present

## 2018-02-12 DIAGNOSIS — Z8249 Family history of ischemic heart disease and other diseases of the circulatory system: Secondary | ICD-10-CM

## 2018-02-12 LAB — I-STAT CHEM 8, ED
BUN: 12 mg/dL (ref 6–20)
Calcium, Ion: 1 mmol/L — ABNORMAL LOW (ref 1.15–1.40)
Chloride: 104 mmol/L (ref 98–111)
Creatinine, Ser: 0.5 mg/dL (ref 0.44–1.00)
GLUCOSE: 108 mg/dL — AB (ref 70–99)
HCT: 30 % — ABNORMAL LOW (ref 36.0–46.0)
Hemoglobin: 10.2 g/dL — ABNORMAL LOW (ref 12.0–15.0)
Potassium: 3.1 mmol/L — ABNORMAL LOW (ref 3.5–5.1)
Sodium: 141 mmol/L (ref 135–145)
TCO2: 27 mmol/L (ref 22–32)

## 2018-02-12 LAB — I-STAT BETA HCG BLOOD, ED (MC, WL, AP ONLY): I-stat hCG, quantitative: <5 m[IU]/mL (ref <5)

## 2018-02-12 LAB — I-STAT TROPONIN, ED: Troponin i, poc: 0 ng/mL (ref 0.00–0.08)

## 2018-02-12 NOTE — ED Provider Notes (Addendum)
[facility]   CSN: [id number] Arrival date & time: 02/12/18  2249     History   Chief Complaint Chief Complaint  Patient presents with  . Numbness    Numbness in face and left arm    HPI Carol Sanders is a 42 y.o. female.  HPI   42 yo F with PMHx recurrent uRI here with facial and arm numbness. Pt states that she was normal until around 8 PM , when she developed acute onset of left-sided facial, arm, and leg numbness. She also c/o left-sided visual field deficits. She states she has an associated aching, throbbing posterior hA. She has a h/o left-sided numbness in past but this is new and worsened for her. No recent trauma or infectious sx. No dysarthria, dysphagia. No difficulty speaking.  Activated as code stroke on arrival.  Past Medical History:  Diagnosis Date  . Allergic rhinitis   . Recurrent upper respiratory infection (URI)     Patient Active Problem List   Diagnosis Date Noted  . Other seasonal allergic rhinitis 03/20/2014    Past Surgical History:  Procedure Laterality Date  . ADENOIDECTOMY    . TONSILLECTOMY       OB History   No obstetric history on file.      Home Medications    Prior to Admission medications   Medication Sig Start Date End Date Taking? Authorizing Provider  amitriptyline (ELAVIL) 100 MG tablet Take 100 mg by mouth at bedtime.   Yes [provider]  amoxicillin (AMOXIL) 500 MG capsule Take 500 mg by mouth 3 (three) times daily.   Yes [provider]  cetirizine (ZYRTEC) 10 MG tablet Take 10 mg by mouth daily.   Yes [provider]  losartan (COZAAR) 50 MG tablet TAKE 1 TABLET(50 MG) BY MOUTH DAILY Patient taking differently: Take 50 mg by mouth daily.  12/18/16  Yes Kozlow, Alvira PhilipsEric J, MD  omeprazole (PRILOSEC) 40 MG capsule Take one capsule every morning before breakfast Patient taking differently: Take 40 mg by mouth daily.  05/15/16  Yes Kozlow, Alvira PhilipsEric  J, MD  ranitidine (ZANTAC) 150 MG tablet Take two tablets at bedtime Patient taking differently: Take 300 mg by mouth at bedtime. Take two tablets at bedtime 05/15/16  Yes Kozlow, Alvira PhilipsEric J, MD  fluticasone [facility](FLONASE) 50 MCG/ACT nasal spray 1 spray in each nostril twice a day. Remain on this medicine year around Patient not taking: Reported on 07/10/2016 03/20/14   Tonye Pearsonoolittle, Robert P, MD  montelukast (SINGULAIR) 10 MG tablet Take 1 tablet (10 mg total) by mouth at bedtime. Patient not taking: Reported on 02/12/2018 04/19/16   Jessica PriestKozlow, Eric J, MD    Family History No family history on file.  Social History Social History   Tobacco Use  . Smoking status: Never Smoker  . Smokeless tobacco: Never Used  Substance Use Topics  . Alcohol use: No    Alcohol/week: 0.0 standard drinks  . Drug use: No     Allergies   Benadryl [diphenhydramine hcl (sleep)]   Review of Systems Review of Systems  Constitutional: Negative for chills and fever.  HENT: Negative for congestion, rhinorrhea and sore throat.   Eyes: Negative for visual disturbance.  Respiratory: Negative for cough, shortness of breath and wheezing.   Cardiovascular: Negative for chest pain and leg swelling.  Gastrointestinal: Negative for abdominal pain, diarrhea, nausea and vomiting.  Genitourinary: Negative for dysuria, flank pain, vaginal bleeding and vaginal discharge.  Musculoskeletal: Negative  for neck pain.  Skin: Negative for rash.  Allergic/Immunologic: Negative for immunocompromised state.  Neurological: Positive for facial asymmetry, weakness and numbness. Negative for syncope and headaches.  Hematological: Does not bruise/bleed easily.  All other systems reviewed and are negative.    Physical Exam Updated Vital Signs BP 118/87   Pulse 71   Resp 18   Ht 5\' 3"  (1.6 m)   Wt 49.4 kg   SpO2 98%   BMI 19.31 kg/m   Physical Exam Vitals signs and nursing note reviewed.  Constitutional:      General: She is not in  acute distress.    Appearance: She is well-developed.  HENT:     Head: Normocephalic and atraumatic.  Eyes:     Conjunctiva/sclera: Conjunctivae normal.  Neck:     Musculoskeletal: Neck supple.  Cardiovascular:     Rate and Rhythm: Normal rate and regular rhythm.     Heart sounds: Normal heart sounds. No murmur. No friction rub.  Pulmonary:     Effort: Pulmonary effort is normal. No respiratory distress.     Breath sounds: Normal breath sounds. No wheezing or rales.  Abdominal:     General: There is no distension.     Palpations: Abdomen is soft.     Tenderness: There is no abdominal tenderness.  Skin:    General: Skin is warm.     Capillary Refill: Capillary refill takes less than 2 seconds.  Neurological:     Mental Status: She is alert and oriented to person, place, and time.     Motor: No abnormal muscle tone.     Neurological Exam:  Mental Status: Alert and oriented to person, place, and time. Attention and concentration normal. Speech clear. Recent memory is intact. Cranial Nerves: Visual fields grossly intact. EOMI and PERRLA. No nystagmus noted. Facial sensation diminished at left forehead, maxillary cheek, and chin/mandible bilaterally. Subtle L NLF. Hearing grossly normal. Uvula is midline, and palate elevates symmetrically. Normal SCM and trapezius strength. Tongue midline without fasciculations. Motor: Muscle strength 5/5 in proximal and distal UE and LE bilaterally. No pronator drift. Muscle tone normal. Reflexes: 2+ and symmetrical in all four extremities.  Sensation: Intact to light touch in upper and lower extremities distally bilaterally though subjectively diminished LUE and LLE. Gait: Normal without ataxia. Coordination: Normal FTN bilaterally.    ED Treatments / Results  Labs (all labs ordered are listed, but only abnormal results are displayed) Labs Reviewed  CBC - Abnormal; Notable for the following components:      Result Value   Hemoglobin 7.8 (*)      HCT 28.2 (*)    MCV 61.0 (*)    MCH 16.9 (*)    MCHC 27.7 (*)    RDW 21.2 (*)    All other components within normal limits  DIFFERENTIAL - Abnormal; Notable for the following components:   Eosinophils Absolute 1.0 (*)    All other components within normal limits  COMPREHENSIVE METABOLIC PANEL - Abnormal; Notable for the following components:   Potassium 3.1 (*)    Glucose, Bld 108 (*)    Calcium 8.1 (*)    AST 14 (*)    All other components within normal limits  URINALYSIS, ROUTINE W REFLEX MICROSCOPIC - Abnormal; Notable for the following components:   Color, Urine STRAW (*)    Hgb urine dipstick SMALL (*)    All other components within normal limits  I-STAT CHEM 8, ED - Abnormal; Notable for the following components:  Potassium 3.1 (*)    Glucose, Bld 108 (*)    Calcium, Ion 1.00 (*)    Hemoglobin 10.2 (*)    HCT 30.0 (*)    All other components within normal limits  ETHANOL  PROTIME-INR  APTT  RAPID URINE DRUG SCREEN, HOSP PERFORMED  I-STAT TROPONIN, ED  I-STAT BETA HCG BLOOD, ED ([facility], [facility], [facility] ONLY)    EKG None  Radiology Mr Maxine Glenn Head Wo Contrast  Result Date: 02/13/2018 CLINICAL DATA:  LEFT facial numbness radiating to LEFT arm tonight. EXAM: MRI HEAD WITHOUT CONTRAST MRA HEAD WITHOUT CONTRAST TECHNIQUE: Multiplanar, multiecho pulse sequences of the brain and surrounding structures were obtained without intravenous contrast. Angiographic images of the head were obtained using MRA technique without contrast. Axial T2 FLAIR, axial susceptibility weighted imaging, coronal T2 and axial T1 sequences not obtained. Patient did not wish to finish examination. COMPARISON:  CT HEAD February 12, 2018. FINDINGS: MRI HEAD FINDINGS INTRACRANIAL CONTENTS: 12 mm reduced diffusion RIGHT thalamus to cerebral peduncle with low ADC values patchy frontal parietal subcortical white matter T2 hyperintensities. The ventricles and sulci are normal for patient's age. No suspicious parenchymal  signal, masses, mass effect. No abnormal extra-axial fluid collections. No extra-axial masses. VASCULAR: Normal major intracranial vascular flow voids present at skull base. SKULL AND UPPER CERVICAL SPINE: No abnormal sellar expansion. Included pituitary. No suspicious calvarial bone marrow signal. Craniocervical junction maintained. SINUSES/ORBITS: Mild ethmoid sinusitis. The included ocular globes and orbital contents are non-suspicious. OTHER: None. MRA HEAD FINDINGS-moderately motion degraded images through the circle-of-Willis. ANTERIOR CIRCULATION: Normal flow related enhancement of the included cervical, petrous, cavernous and supraclinoid internal carotid arteries. Patent anterior communicating artery. Patent anterior and middle cerebral arteries. POSTERIOR CIRCULATION: Codominant vertebral arteries. Vertebrobasilar arteries are patent, with normal flow related enhancement of the main branch vessels. Patent posterior cerebral arteries. Robust RIGHT posterior communicating artery present. ANATOMIC VARIANTS: None. Source images and MIP images were reviewed. IMPRESSION: MRI HEAD: 1. 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 2. Patchy bifrontal parietal subcortical white matter T2 hyperintensities seen with vasculopathy, Lyme disease, less likely TBI or chronic small vessel ischemic changes. Recommend contrast enhanced images and/or 3 to six-month follow-up to establish stability of findings. MRA HEAD: 1. Motion degraded examination without emergent large vessel occlusion. Limited assessment of circle-of-Willis. 2. To assess for stenosis or vasculitis, CTA HEAD could be performed. Critical Value/emergent results text paged to Dr.ERIC [facility] via AMION secure system on 02/13/2018 at 2:32 am, including interpreting physician's phone number. Electronically Signed   By: Awilda Metro M.D.   On: 02/13/2018 02:32   Mr Brain Wo Contrast  Result Date:  02/13/2018 CLINICAL DATA:  LEFT facial numbness radiating to LEFT arm tonight. EXAM: MRI HEAD WITHOUT CONTRAST MRA HEAD WITHOUT CONTRAST TECHNIQUE: Multiplanar, multiecho pulse sequences of the brain and surrounding structures were obtained without intravenous contrast. Angiographic images of the head were obtained using MRA technique without contrast. Axial T2 FLAIR, axial susceptibility weighted imaging, coronal T2 and axial T1 sequences not obtained. Patient did not wish to finish examination. COMPARISON:  CT HEAD February 12, 2018. FINDINGS: MRI HEAD FINDINGS INTRACRANIAL CONTENTS: 12 mm reduced diffusion RIGHT thalamus to cerebral peduncle with low ADC values patchy frontal parietal subcortical white matter T2 hyperintensities. The ventricles and sulci are normal for patient's age. No suspicious parenchymal signal, masses, mass effect. No abnormal extra-axial fluid collections. No extra-axial masses. VASCULAR: Normal major intracranial vascular flow voids present at skull base.  SKULL AND UPPER CERVICAL SPINE: No abnormal sellar expansion. Included pituitary. No suspicious calvarial bone marrow signal. Craniocervical junction maintained. SINUSES/ORBITS: Mild ethmoid sinusitis. The included ocular globes and orbital contents are non-suspicious. OTHER: None. MRA HEAD FINDINGS-moderately motion degraded images through the circle-of-Willis. ANTERIOR CIRCULATION: Normal flow related enhancement of the included cervical, petrous, cavernous and supraclinoid internal carotid arteries. Patent anterior communicating artery. Patent anterior and middle cerebral arteries. POSTERIOR CIRCULATION: Codominant vertebral arteries. Vertebrobasilar arteries are patent, with normal flow related enhancement of the main branch vessels. Patent posterior cerebral arteries. Robust RIGHT posterior communicating artery present. ANATOMIC VARIANTS: None. Source images and MIP images were reviewed. IMPRESSION: MRI HEAD: 1. Limited 4 sequence  MRI head: Acute 12 mm RIGHT thalamus to cerebral peduncle nonhemorrhagic infarct. 2. Patchy bifrontal parietal subcortical white matter T2 hyperintensities seen with vasculopathy, Lyme disease, less likely TBI or chronic small vessel ischemic changes. Recommend contrast enhanced images and/or 3 to six-month follow-up to establish stability of findings. MRA HEAD: 1. Motion degraded examination without emergent large vessel occlusion. Limited assessment of circle-of-Willis. 2. To assess for stenosis or vasculitis, CTA HEAD could be performed. Critical Value/emergent results text paged to Dr.ERIC [facility] via AMION secure system on 02/13/2018 at 2:32 am, including interpreting physician's phone number. Electronically Signed   By: Awilda Metro M.D.   On: 02/13/2018 02:32   Ct Head Code Stroke Wo Contrast  Result Date: 02/12/2018 CLINICAL DATA:  Code stroke. LEFT arm and tongue numbness. Last seen normal at 8 a.m. EXAM: CT HEAD WITHOUT CONTRAST TECHNIQUE: Contiguous axial images were obtained from the base of the skull through the vertex without intravenous contrast. COMPARISON:  None. FINDINGS: BRAIN: No intraparenchymal hemorrhage, mass effect nor midline shift. Faint bifrontal subcortical white matter hypodensities. The ventricles and sulci are normal. No acute large vascular territory infarcts. No abnormal extra-axial fluid collections. Basal cisterns are patent. VASCULAR: Unremarkable. SKULL/SOFT TISSUES: No skull fracture. No significant soft tissue swelling. ORBITS/SINUSES: The included ocular globes and orbital contents are normal.Mild lobulated paranasal sinus mucosal thickening. Mastoid air cells are well aerated. OTHER: None. ASPECTS [facility] Stroke Program Early CT Score) - Ganglionic level infarction (caudate, lentiform nuclei, internal capsule, insula, M1-M3 cortex): 7 - Supraganglionic infarction (M4-M6 cortex): 3 Total score (0-10 with 10 being normal): 10 IMPRESSION: 1. No acute intracranial  process. 2. ASPECTS is 10. 3. Bifrontal white matter changes seen with old shear injury, chronic small vessel ischemic changes or artifact. Findings would better characterized on MRI of the brain with and without contrast. 4. Critical Value/emergent results text paged to Dr.Lindzen, Neurology via AMION secure system on 02/12/2018 at 11:34 pm, including interpreting physician's phone number. Electronically Signed   By: Awilda Metro M.D.   On: 02/12/2018 23:34    Procedures .Critical Care Performed by: Shaune Pollack, MD Authorized by: Shaune Pollack, MD   Critical care provider statement:    Critical care time (minutes):  35   Critical care time was exclusive of:  Separately billable procedures and treating other patients and teaching time   Critical care was necessary to treat or prevent imminent or life-threatening deterioration of the following conditions:  CNS failure or compromise   Critical care was time spent personally by me on the following activities:  Development of treatment plan with patient or surrogate, discussions with consultants, evaluation of patient's response to treatment, examination of patient, obtaining history from patient or surrogate, ordering and performing treatments and interventions, ordering and review of laboratory studies, ordering and review of radiographic studies,  pulse oximetry, re-evaluation of patient's condition and review of old charts   I assumed direction of critical care for this patient from another provider in my specialty: no     (including critical care time)  Medications Ordered in ED Medications  aspirin chewable tablet 81 mg (has no administration in time range)  prochlorperazine (COMPAZINE) injection 10 mg (10 mg Intravenous Given 02/13/18 0056)  aspirin EC tablet 650 mg (650 mg Oral Given 02/13/18 0340)     Initial Impression / Assessment and Plan / ED Course  I have reviewed the triage vital signs and the nursing  notes.  Pertinent labs & imaging results that were available during my care of the patient were reviewed by me and considered in my medical decision making (see chart for details).     42 yo F here w/ left-sided numbness, including face. Activated as code stroke on arrival. Pt also with HA some given compazine for possible migraine component. MRI obtained per Neuro and shows acute thalamic stroke, possible underlying vasculopathy. Of note, Hgb also 7.8 - will need further work-up and tx as inpt. No h/o CAD, no CP, no SOB, hold on transfusion at this time.  Final Clinical Impressions(s) / ED Diagnoses   Final diagnoses:  Cerebrovascular accident (CVA), unspecified mechanism (HCC)  Anemia, unspecified type    ED Discharge Orders    None       Shaune Pollack, MD 02/13/18 0405    Shaune Pollack, MD 02/23/18 [phone]

## 2018-02-12 NOTE — ED Notes (Addendum)
Last known normal approx. 8 pm tonight

## 2018-02-12 NOTE — Consult Note (Signed)
NEURO HOSPITALIST CONSULT NOTE   Requestig physician: Dr. Rush Landmarkegeler  Reason for Consult: Left face and arm sensory numbness  History obtained from:   Patient and Chart    HPI:                                                                                                                                          Carol Sanders is an 42 y.o. female who presented to the ED via EMS with c/c of left sided facial and arm numbness beginning at 8:00 PM. She stated that she has a history of intermittent left sided numbness, but that it has never extended to her face. She also complained of left sided visual blurring. Code Stroke was called and STAT CT head was ordered. Neurological exam in CT was nonfocal, with no visual field cut, motor or sensory deficit noted. She remained symptomatic with perception of left sided face and arm numbness despite symmetric temp and FT sensation.   Past Medical History:  Diagnosis Date  . Allergic rhinitis   . Recurrent upper respiratory infection (URI)     Past Surgical History:  Procedure Laterality Date  . ADENOIDECTOMY    . TONSILLECTOMY      No family history on file.            Social History:  reports that she has never smoked. She has never used smokeless tobacco. She reports that she does not drink alcohol or use drugs.  Allergies  Allergen Reactions  . Benadryl [Diphenhydramine Hcl (Sleep)] Cough    HOME MEDICATIONS:                                                                                                                        ROS:  As per HPI. No other complaints.    Blood pressure (!) 149/101, pulse 77, resp. rate 18, height 5\' 3"  (1.6 m), weight 49.4 kg, SpO2 94 %.   General Examination:                                                                                                        Physical Exam  HEENT-  [place]/AT    Lungs- Respirations unlabored Extremities- No edema  Neurological Examination Mental Status:  Alert, oriented, thought content appropriate.  Speech fluent without evidence of aphasia.  Able to follow all commands without difficulty. Anxious affect.  Cranial Nerves: II:  Visual fields intact with no extinction to DSS.  III,IV, VI: EOMI with saccadic quality of visual pursuits noted. No ptosis. No nystagmus.  V,VII: No facial droop. Facial temp sensation equal bilaterally VIII: hearing intact to voice IX,X: No hypophonia or hoarseness XI: Symmetric XII: midline tongue extension Motor: Right : Upper extremity   5/5    Left:     Upper extremity   5/5  Lower extremity   5/5     Lower extremity   5/5 Normal tone throughout; no atrophy noted No pronator drift.  Sensory: Temp and light touch intact proximally in all 4 extremities. No extinction.  Deep Tendon Reflexes: 2+ and symmetric throughout Plantars: Right: downgoing   Left: downgoing Cerebellar: No ataxia with FNF or H-S bilaterally  Gait: Deferred   Lab Results: Basic Metabolic Panel: No results for input(s): NA, K, CL, CO2, GLUCOSE, BUN, CREATININE, CALCIUM, MG, PHOS in the last 168 hours.  CBC: No results for input(s): WBC, NEUTROABS, HGB, HCT, MCV, PLT in the last 168 hours.  Cardiac Enzymes: No results for input(s): CKTOTAL, CKMB, CKMBINDEX, TROPONINI in the last 168 hours.  Lipid Panel: No results for input(s): CHOL, TRIG, HDL, CHOLHDL, VLDL, LDLCALC in the last 168 hours.  Imaging: Ct Head Code Stroke Wo Contrast  Result Date: 02/12/2018 CLINICAL DATA:  Code stroke. LEFT arm and tongue numbness. Last seen normal at 8 a.m. EXAM: CT HEAD WITHOUT CONTRAST TECHNIQUE: Contiguous axial images were obtained from the base of the skull through the vertex without intravenous contrast. COMPARISON:  None. FINDINGS: BRAIN: No intraparenchymal hemorrhage, mass effect nor midline shift. Faint  bifrontal subcortical white matter hypodensities. The ventricles and sulci are normal. No acute large vascular territory infarcts. No abnormal extra-axial fluid collections. Basal cisterns are patent. VASCULAR: Unremarkable. SKULL/SOFT TISSUES: No skull fracture. No significant soft tissue swelling. ORBITS/SINUSES: The included ocular globes and orbital contents are normal.Mild lobulated paranasal sinus mucosal thickening. Mastoid air cells are well aerated. OTHER: None. ASPECTS [facility] Stroke Program Early CT Score) - Ganglionic level infarction (caudate, lentiform nuclei, internal capsule, insula, M1-M3 cortex): 7 - Supraganglionic infarction (M4-M6 cortex): 3 Total score (0-10 with 10 being normal): 10 IMPRESSION: 1. No acute intracranial process. 2. ASPECTS is 10. 3. Bifrontal white matter changes seen with old shear injury, chronic small vessel ischemic changes or artifact. Findings would better characterized on MRI of the brain with and without contrast. 4. Critical Value/emergent results  text paged to Dr.Alean Kromer, Neurology via [employer]ION secure system on 02/12/2018 at 11:34 pm, including interpreting physician's phone number. Electronically Signed   By: Awilda Metro M.D.   On: 02/12/2018 23:34    Assessment: 42 year old female with acute onset of subjective left face and LUE sensory numbness with left sided visual blurring beginning at 8 PM.  1. Neurological exam is nonfocal, with no visual field cut, motor or sensory deficit noted. She remains symptomatic with perception of left sided face and arm numbness despite symmetric temp and FT sensation.  2. CT head shows no acute intracranial process. Bifrontal white matter changes are present, which may be seen with old shear injury, chronic small vessel ischemic changes or artifact.  3. IV tPA not indicated due to NIHSS of 0  Recommendations: 1. STAT MRI/MRA of brain.  2. Further neurological recommendations pending MRI results.   Electronically  signed: Dr. Caryl Pina 02/12/2018, 11:38 PM

## 2018-02-12 NOTE — ED Triage Notes (Signed)
EMS coming by EMS with complaints of left sided facial numbness that extends down left arm that started about 40 minutes ago at home. Pt has stated she has had intermittent numbness of left side by never extending in to her face. Notes that her left sided vision is blurry. No stroke history. No facial drooping noted.

## 2018-02-12 NOTE — ED Notes (Signed)
Code stroke initiated by MD.

## 2018-02-12 NOTE — ED Notes (Signed)
Pt was transported to CT where Neurologist assessed pt. After CT it was decided to discontinue code stroke

## 2018-02-13 ENCOUNTER — Inpatient Hospital Stay (HOSPITAL_COMMUNITY): Payer: BLUE CROSS/BLUE SHIELD

## 2018-02-13 ENCOUNTER — Other Ambulatory Visit (HOSPITAL_COMMUNITY): Payer: BLUE CROSS/BLUE SHIELD

## 2018-02-13 ENCOUNTER — Emergency Department (HOSPITAL_COMMUNITY): Payer: BLUE CROSS/BLUE SHIELD

## 2018-02-13 ENCOUNTER — Encounter (HOSPITAL_COMMUNITY): Payer: Self-pay | Admitting: Internal Medicine

## 2018-02-13 DIAGNOSIS — Z79899 Other long term (current) drug therapy: Secondary | ICD-10-CM | POA: Diagnosis not present

## 2018-02-13 DIAGNOSIS — H538 Other visual disturbances: Secondary | ICD-10-CM | POA: Diagnosis present

## 2018-02-13 DIAGNOSIS — I639 Cerebral infarction, unspecified: Principal | ICD-10-CM

## 2018-02-13 DIAGNOSIS — D649 Anemia, unspecified: Secondary | ICD-10-CM

## 2018-02-13 DIAGNOSIS — J309 Allergic rhinitis, unspecified: Secondary | ICD-10-CM | POA: Diagnosis present

## 2018-02-13 DIAGNOSIS — I10 Essential (primary) hypertension: Secondary | ICD-10-CM | POA: Diagnosis not present

## 2018-02-13 DIAGNOSIS — R200 Anesthesia of skin: Secondary | ICD-10-CM | POA: Diagnosis present

## 2018-02-13 DIAGNOSIS — R9082 White matter disease, unspecified: Secondary | ICD-10-CM | POA: Diagnosis not present

## 2018-02-13 DIAGNOSIS — T465X6A Underdosing of other antihypertensive drugs, initial encounter: Secondary | ICD-10-CM | POA: Diagnosis present

## 2018-02-13 DIAGNOSIS — E876 Hypokalemia: Secondary | ICD-10-CM | POA: Diagnosis present

## 2018-02-13 DIAGNOSIS — Z823 Family history of stroke: Secondary | ICD-10-CM | POA: Diagnosis not present

## 2018-02-13 DIAGNOSIS — I6389 Other cerebral infarction: Secondary | ICD-10-CM | POA: Diagnosis not present

## 2018-02-13 DIAGNOSIS — R29700 NIHSS score 0: Secondary | ICD-10-CM | POA: Diagnosis present

## 2018-02-13 DIAGNOSIS — Z888 Allergy status to other drugs, medicaments and biological substances status: Secondary | ICD-10-CM | POA: Diagnosis not present

## 2018-02-13 DIAGNOSIS — D509 Iron deficiency anemia, unspecified: Secondary | ICD-10-CM | POA: Diagnosis present

## 2018-02-13 DIAGNOSIS — Z91138 Patient's unintentional underdosing of medication regimen for other reason: Secondary | ICD-10-CM | POA: Diagnosis not present

## 2018-02-13 DIAGNOSIS — E785 Hyperlipidemia, unspecified: Secondary | ICD-10-CM | POA: Diagnosis present

## 2018-02-13 DIAGNOSIS — Z8249 Family history of ischemic heart disease and other diseases of the circulatory system: Secondary | ICD-10-CM | POA: Diagnosis not present

## 2018-02-13 LAB — URINALYSIS, ROUTINE W REFLEX MICROSCOPIC
Bacteria, UA: NONE SEEN
Bilirubin Urine: NEGATIVE
Glucose, UA: NEGATIVE mg/dL
Ketones, ur: NEGATIVE mg/dL
Leukocytes, UA: NEGATIVE
Nitrite: NEGATIVE
Protein, ur: NEGATIVE mg/dL
Specific Gravity, Urine: 1.009 (ref 1.005–1.030)
pH: 8 (ref 5.0–8.0)

## 2018-02-13 LAB — COMPREHENSIVE METABOLIC PANEL
ALT: 11 U/L (ref 0–44)
AST: 14 U/L — ABNORMAL LOW (ref 15–41)
Albumin: 3.7 g/dL (ref 3.5–5.0)
Alkaline Phosphatase: 47 U/L (ref 38–126)
Anion gap: 11 (ref 5–15)
BUN: 11 mg/dL (ref 6–20)
CO2: 25 mmol/L (ref 22–32)
Calcium: 8.1 mg/dL — ABNORMAL LOW (ref 8.9–10.3)
Chloride: 105 mmol/L (ref 98–111)
Creatinine, Ser: 0.57 mg/dL (ref 0.44–1.00)
GFR calc Af Amer: >60 mL/min (ref >60)
GFR calc non Af Amer: >60 mL/min (ref >60)
Glucose, Bld: 108 mg/dL — ABNORMAL HIGH (ref 70–99)
POTASSIUM: 3.1 mmol/L — AB (ref 3.5–5.1)
Sodium: 141 mmol/L (ref 135–145)
Total Bilirubin: 0.3 mg/dL (ref 0.3–1.2)
Total Protein: 6.9 g/dL (ref 6.5–8.1)

## 2018-02-13 LAB — IRON AND TIBC
Iron: 11 ug/dL — ABNORMAL LOW (ref 28–170)
Saturation Ratios: 2 % — ABNORMAL LOW (ref 10.4–31.8)
TIBC: 456 ug/dL — ABNORMAL HIGH (ref 250–450)
UIBC: 445 ug/dL

## 2018-02-13 LAB — LIPID PANEL
Cholesterol: 176 mg/dL (ref 0–200)
HDL: 49 mg/dL (ref >40)
LDL Cholesterol: 112 mg/dL — ABNORMAL HIGH (ref 0–99)
Total CHOL/HDL Ratio: 3.6 RATIO
Triglycerides: 77 mg/dL (ref <150)
VLDL: 15 mg/dL (ref 0–40)

## 2018-02-13 LAB — DIFFERENTIAL
Abs Immature Granulocytes: 0.01 10*3/uL (ref 0.00–0.07)
Basophils Absolute: 0.1 10*3/uL (ref 0.0–0.1)
Basophils Relative: 1 %
Eosinophils Absolute: 1 10*3/uL — ABNORMAL HIGH (ref 0.0–0.5)
Eosinophils Relative: 15 %
Immature Granulocytes: 0 %
Lymphocytes Relative: 31 %
Lymphs Abs: 2 10*3/uL (ref 0.7–4.0)
Monocytes Absolute: 0.6 10*3/uL (ref 0.1–1.0)
Monocytes Relative: 9 %
Neutro Abs: 2.9 10*3/uL (ref 1.7–7.7)
Neutrophils Relative %: 44 %

## 2018-02-13 LAB — VITAMIN B12: Vitamin B-12: 774 pg/mL (ref 180–914)

## 2018-02-13 LAB — CBC
HCT: 28.2 % — ABNORMAL LOW (ref 36.0–46.0)
Hemoglobin: 7.8 g/dL — ABNORMAL LOW (ref 12.0–15.0)
MCH: 16.9 pg — ABNORMAL LOW (ref 26.0–34.0)
MCHC: 27.7 g/dL — AB (ref 30.0–36.0)
MCV: 61 fL — ABNORMAL LOW (ref 80.0–100.0)
Platelets: 335 10*3/uL (ref 150–400)
RBC: 4.62 MIL/uL (ref 3.87–5.11)
RDW: 21.2 % — AB (ref 11.5–15.5)
WBC: 6.5 10*3/uL (ref 4.0–10.5)
nRBC: 0 % (ref 0.0–0.2)

## 2018-02-13 LAB — FERRITIN: Ferritin: 2 ng/mL — ABNORMAL LOW (ref 11–307)

## 2018-02-13 LAB — FOLATE: Folate: 12.3 ng/mL (ref >5.9)

## 2018-02-13 LAB — RAPID URINE DRUG SCREEN, HOSP PERFORMED
AMPHETAMINES: NOT DETECTED
Barbiturates: NOT DETECTED
Benzodiazepines: NOT DETECTED
Cocaine: NOT DETECTED
Opiates: NOT DETECTED
Tetrahydrocannabinol: NOT DETECTED

## 2018-02-13 LAB — HEMOGLOBIN A1C
Hgb A1c MFr Bld: 5.4 % (ref 4.8–5.6)
Mean Plasma Glucose: 108.28 mg/dL

## 2018-02-13 LAB — PROTIME-INR
INR: 0.96
Prothrombin Time: 12.7 seconds (ref 11.4–15.2)

## 2018-02-13 LAB — RETICULOCYTES
Immature Retic Fract: 27 % — ABNORMAL HIGH (ref 2.3–15.9)
RBC.: 4.57 MIL/uL (ref 3.87–5.11)
Retic Count, Absolute: 58 10*3/uL (ref 19.0–186.0)
Retic Ct Pct: 1.3 % (ref 0.4–3.1)

## 2018-02-13 LAB — ETHANOL

## 2018-02-13 LAB — APTT: aPTT: 32 seconds (ref 24–36)

## 2018-02-13 MED ORDER — ACETAMINOPHEN 650 MG RE SUPP: 650.0000 mg | RECTAL | Status: DC | PRN

## 2018-02-13 MED ORDER — ENOXAPARIN SODIUM 40 MG/0.4ML ~~LOC~~ SOLN
40.0000 mg | SUBCUTANEOUS | Status: DC
  Administered 2018-02-13 – 2018-02-14 (×2): 40 mg via SUBCUTANEOUS
  Filled 2018-02-13 (×2): qty 0.4

## 2018-02-13 MED ORDER — FERROUS GLUCONATE 324 (38 FE) MG PO TABS
324.0000 mg | ORAL_TABLET | Freq: Every day | ORAL | Status: DC
  Administered 2018-02-14: 324 mg via ORAL
  Filled 2018-02-13: qty 1

## 2018-02-13 MED ORDER — AMITRIPTYLINE HCL 100 MG PO TABS
100.0000 mg | ORAL_TABLET | Freq: Every day | ORAL | Status: DC
  Administered 2018-02-13: 100 mg via ORAL
  Filled 2018-02-13: qty 4
  Filled 2018-02-13 (×2): qty 1

## 2018-02-13 MED ORDER — IOPAMIDOL (ISOVUE-370) INJECTION 76%
100.0000 mL | Freq: Once | INTRAVENOUS | Status: AC | PRN
  Administered 2018-02-13: 75 mL via INTRAVENOUS

## 2018-02-13 MED ORDER — ACETAMINOPHEN 160 MG/5ML PO SOLN: 650.0000 mg | ORAL | Status: DC | PRN

## 2018-02-13 MED ORDER — POTASSIUM CHLORIDE CRYS ER 20 MEQ PO TBCR: 40.0000 meq | EXTENDED_RELEASE_TABLET | Freq: Once | ORAL | Status: DC

## 2018-02-13 MED ORDER — POTASSIUM CHLORIDE CRYS ER 20 MEQ PO TBCR
40.0000 meq | EXTENDED_RELEASE_TABLET | Freq: Once | ORAL | Status: AC
  Administered 2018-02-13: 40 meq via ORAL
  Filled 2018-02-13: qty 2

## 2018-02-13 MED ORDER — ACETAMINOPHEN 325 MG PO TABS: 650.0000 mg | ORAL_TABLET | ORAL | Status: DC | PRN

## 2018-02-13 MED ORDER — ATORVASTATIN CALCIUM 40 MG PO TABS
40.0000 mg | ORAL_TABLET | Freq: Every day | ORAL | Status: DC
  Administered 2018-02-13 – 2018-02-14 (×2): 40 mg via ORAL
  Filled 2018-02-13 (×2): qty 1

## 2018-02-13 MED ORDER — ASPIRIN EC 325 MG PO TBEC
650.0000 mg | DELAYED_RELEASE_TABLET | Freq: Once | ORAL | Status: AC
  Administered 2018-02-13: 650 mg via ORAL
  Filled 2018-02-13: qty 2

## 2018-02-13 MED ORDER — PROCHLORPERAZINE EDISYLATE 10 MG/2ML IJ SOLN
10.0000 mg | Freq: Once | INTRAMUSCULAR | Status: AC
  Administered 2018-02-13: 10 mg via INTRAVENOUS
  Filled 2018-02-13: qty 2

## 2018-02-13 MED ORDER — STROKE: EARLY STAGES OF RECOVERY BOOK
Freq: Once | Status: DC
  Filled 2018-02-13: qty 1

## 2018-02-13 MED ORDER — PANTOPRAZOLE SODIUM 40 MG PO TBEC
80.0000 mg | DELAYED_RELEASE_TABLET | Freq: Every day | ORAL | Status: DC
  Administered 2018-02-13 – 2018-02-14 (×2): 80 mg via ORAL
  Filled 2018-02-13 (×2): qty 2

## 2018-02-13 MED ORDER — ASPIRIN 81 MG PO CHEW
81.0000 mg | CHEWABLE_TABLET | Freq: Every day | ORAL | Status: DC
  Administered 2018-02-13 – 2018-02-14 (×2): 81 mg via ORAL
  Filled 2018-02-13 (×2): qty 1

## 2018-02-13 NOTE — Evaluation (Signed)
Physical Therapy Evaluation Patient Details Name: Carol Sanders MRN: [id number] DOB: 1975/04/03 Today's Date: 02/13/2018   History of Present Illness  Patient is a 42 year old female with no significant past medical history came in to the hospital with left-sided facial and left arm numbness, MRI was positive for acute R thalamic stroke.   Clinical Impression  Patient presents close to functional baseline.  Feel she is safe to ambulate in hallway with family or staff and encouraged her to do so to improve feeling of unsteadiness.  Noted L UE decreased coordination and feel can be address by OT.  No further skilled PT needs identified.  PT to sign off.     Follow Up Recommendations No PT follow up    Equipment Recommendations  None recommended by PT    Recommendations for Other Services       Precautions / Restrictions Precautions Precautions: None Restrictions Weight Bearing Restrictions: No      Mobility  Bed Mobility Overal bed mobility: Modified Independent                Transfers Overall transfer level: Independent                  Ambulation/Gait Ambulation/Gait assistance: Independent Gait Distance (Feet): 250 Feet Assistive device: None Gait Pattern/deviations: Step-through pattern;Decreased stride length     General Gait Details: reports feeling slightly unsteady than her baseline  Stairs Stairs: Yes Stairs assistance: Supervision;Min guard Stair Management: One rail Right;Alternating pattern Number of Stairs: 10 General stair comments: assist for safety  Wheelchair Mobility    Modified Rankin (Stroke Patients Only) Modified Rankin (Stroke Patients Only) Pre-Morbid Rankin Score: No symptoms Modified Rankin: Slight disability     Balance Overall balance assessment: Needs assistance   Sitting balance-Leahy Scale: Normal       Standing balance-Leahy Scale: Good                   Standardized Balance  Assessment Standardized Balance Assessment : Dynamic Gait Index   Dynamic Gait Index Level Surface: Normal Change in Gait Speed: Mild Impairment Gait with Horizontal Head Turns: Normal Gait with Vertical Head Turns: Mild Impairment Gait and Pivot Turn: Normal Step Over Obstacle: Mild Impairment Step Around Obstacles: Normal Steps: Mild Impairment Total Score: 20       Pertinent Vitals/Pain Pain Assessment: No/denies pain    Home Living Family/patient expects to be discharged to:: Private residence Living Arrangements: Children Available Help at Discharge: Family Type of Home: House Home Access: Stairs to enter Entrance Stairs-Rails: None Secretary/administratorntrance Stairs-Number of Steps: 2 Home Layout: One level Home Equipment: None      Prior Function Level of Independence: Independent         Comments: 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 [employer]     Hand Dominance   Dominant Hand: Left    Extremity/Trunk Assessment   Upper Extremity Assessment Upper Extremity Assessment: LUE deficits/detail LUE Deficits / Details: decreased coordination with finger to nose compared to R    Lower Extremity Assessment Lower Extremity Assessment: Overall WFL for tasks assessed       Communication   Communication: Prefers language other than English(understands most [place], native [place]  Cognition Arousal/Alertness: Awake/alert Behavior During Therapy: WFL for tasks assessed/performed Overall Cognitive Status: Within Functional Limits for tasks assessed  General Comments      Exercises     Assessment/Plan    PT Assessment Patent does not need any further PT services  PT Problem List         PT Treatment Interventions      PT Goals (Current goals can be found in the Care Plan section)  Acute Rehab PT Goals PT Goal Formulation: All assessment and education complete, DC therapy    Frequency     Barriers to discharge         Co-evaluation               AM-PAC PT "6 Clicks" Mobility  Outcome Measure Help needed turning from your back to your side while in a flat bed without using bedrails?: None Help needed moving from lying on your back to sitting on the side of a flat bed without using bedrails?: None Help needed moving to and from a bed to a chair (including a wheelchair)?: None Help needed standing up from a chair using your arms (e.g., wheelchair or bedside chair)?: None Help needed to walk in hospital room?: None Help needed climbing 3-5 steps with a railing? : A Little 6 Click Score: 23    End of Session Equipment Utilized During Treatment: Gait belt Activity Tolerance: Patient tolerated treatment well Patient left: in bed;with call bell/phone within reach;with family/visitor present   PT Visit Diagnosis: Muscle weakness (generalized) (M62.81)    Time: 1550-1605 PT Time Calculation (min) (ACUTE ONLY): 15 min   Charges:   PT Evaluation $PT Eval Low Complexity: 1 Low          Carol Sanders, PT Acute Rehabilitation Services [phone] 02/13/2018   Carol Sanders 02/13/2018, 4:23 PM

## 2018-02-13 NOTE — Progress Notes (Signed)
MRI/MRA completed, revealing an acute right thalamus lacunar infarction.  MRI HEAD: 1. 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 2. Patchy bifrontal parietal subcortical white matter T2 hyperintensities seen with vasculopathy, Lyme disease, less likely TBI or chronic small vessel ischemic changes. Recommend contrast enhanced images and/or 3 to six-month follow-up to establish stability of findings.  MRA HEAD: 1. Motion degraded examination without emergent large vessel occlusion. Limited assessment of circle-of-Willis. 2. To assess for stenosis or vasculitis, CTA HEAD could be Performed.  A/R: 42 year old female with acute ischemic lacunar infarction of right thalamus.  1. Will need full stroke work up including carotid ultrasound and TTE.  2. Permissive HTN x 24 hours, then reduce BP by 15% per day to goal of 120 systolic.  3. ASA 650 mg po load has been ordered. ASA should be continued as 81 mg po qd scheduled.  4. Start atorvastatin 40 mg po qd. Obtain baseline CK level.  5. Cardiac telemetry.  6. PT/OT/Speech.  7. Stroke team to follow patient in the AM.   Electronically signed: Dr. Caryl PinaEric Vaneza Pickart

## 2018-02-13 NOTE — Progress Notes (Signed)
Subjective: Son and daughter are here.  She feels that the numbness on the left side has improved, except for residual numbness in the finger tips.  She now says that she has a history of hypertension for 10 years and was on Losartan at home.  However, she forgot to take it for the last week before this admission.  No history of DVT/PE/stroke in the young in the family.  Objective: Vital signs in last 24 hours: Temp:  [97.6 F (36.4 C)] 97.6 F (36.4 C) (12/27 1300) Pulse Rate:  [65-95] 76 (12/27 1300) Resp:  [16-25] 16 (12/27 1300) BP: (117-163)/(79-103) 128/79 (12/27 1300) SpO2:  [94 %-100 %] 95 % (12/27 1300) Weight:  [49.4 kg] 49.4 kg (12/26 2300)  Intake/Output from previous day: No intake/output data recorded. Intake/Output this shift: No intake/output data recorded. Nutritional status:  Diet Order            Diet Heart Room service appropriate? Yes; Fluid consistency: Thin  Diet effective now              Neurologic Exam:   Awake, alert, fully oriented. Language-fluent Comprehension, naming, repetition- intact Face symmetric.  Tongue midline. Strength 5/5 BUE and BLE. Sensory - intact and symmetrical to pinprick in the face, arms, and legs. Coord- intact. No babinski.  Lab Results: Recent Labs    02/12/18 2331 02/12/18 2339  WBC 6.5  --   HGB 7.8* 10.2*  HCT 28.2* 30.0*  PLT 335  --   NA 141 141  K 3.1* 3.1*  CL 105 104  CO2 25  --   GLUCOSE 108* 108*  BUN 11 12  CREATININE 0.57 0.50  CALCIUM 8.1*  --    Lipid Panel Recent Labs    02/13/18 0529  CHOL 176  TRIG 77  HDL 49  CHOLHDL 3.6  VLDL 15  LDLCALC 161112*    Studies/Results: Ct Angio Head W Or Wo Contrast  Result Date: 02/13/2018 CLINICAL DATA:  Follow up stroke. EXAM: CT ANGIOGRAPHY HEAD AND NECK TECHNIQUE: Multidetector CT imaging of the head and neck was performed using the standard protocol during bolus administration of intravenous contrast. Multiplanar CT image reconstructions and  MIPs were obtained to evaluate the vascular anatomy. Carotid stenosis measurements (when applicable) are obtained utilizing NASCET criteria, using the distal internal carotid diameter as the denominator. CONTRAST:  75mL ISOVUE-370 IOPAMIDOL (ISOVUE-370) INJECTION 76% COMPARISON:  CT HEAD February 12, 2018 and MRI/MRA head February 13, 2018. FINDINGS: CTA NECK FINDINGS: AORTIC ARCH: Normal appearance of the thoracic arch, normal branch pattern. The origins of the innominate, left Common carotid artery and subclavian artery are widely patent. RIGHT CAROTID SYSTEM: Common carotid artery is patent. Normal appearance of the carotid bifurcation without hemodynamically significant stenosis by NASCET criteria. Normal appearance of the internal carotid artery. LEFT CAROTID SYSTEM: Common carotid artery is patent. Normal appearance of the carotid bifurcation without hemodynamically significant stenosis by NASCET criteria. Normal appearance of the internal carotid artery. VERTEBRAL ARTERIES:Codominant vertebral arteries. Mild stenosis LEFT vertebral artery origin. Patent vertebral arteries, no dissection. SKELETON: No acute osseous process though bone windows have not been submitted. OTHER NECK: Soft tissues of the neck are nonacute though, not tailored for evaluation. UPPER CHEST: Included lung apices are clear. No superior mediastinal lymphadenopathy. CTA HEAD FINDINGS: ANTERIOR CIRCULATION: Patent cervical internal carotid arteries, petrous, cavernous and supra clinoid internal carotid arteries. 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 Patent anterior communicating artery. Patent anterior and middle cerebral arteries. No large  vessel occlusion, significant stenosis, contrast extravasation . POSTERIOR CIRCULATION: Patent vertebral arteries, vertebrobasilar junction and basilar artery, as well as main branch vessels. Patent posterior cerebral arteries. Severe stenosis RIGHT P2 segment with  indistinct appearance. Robust RIGHT posterior communicating artery present. No large vessel occlusion,  contrast extravasation or aneurysm. VENOUS SINUSES: Major dural venous sinuses are patent though not tailored for evaluation on this angiographic examination. ANATOMIC VARIANTS: None. DELAYED PHASE: No abnormal intracranial enhancement. MIP images reviewed. IMPRESSION: CTA NECK: 1. No hemodynamically significant stenosis ICA's. 2. Mild stenosis LEFT vertebral artery.  Patent vertebral arteries. CTA HEAD: 1. Flow-limiting stenosis distal RIGHT P2 segment, possible thromboembolism. 2. No emergent large vessel occlusion. 3. 2 mm RIGHT paraophthalmic intact aneurysm. Critical Value/emergent results text paged to Dr.ERIC [facility] via AMION secure system on 02/13/2018 at 6:01 am, including interpreting physician's phone number. Electronically Signed   By: Awilda Metro M.D.   On: 02/13/2018 06:02   Dg Chest 2 View  Result Date: 02/13/2018 CLINICAL DATA:  Acute onset of bilateral hand, lip and facial numbness. EXAM: CHEST - 2 VIEW COMPARISON:  Chest radiograph performed 05/15/2016 FINDINGS: The lungs are well-aerated and clear. There is no evidence of focal opacification, pleural effusion or pneumothorax. The heart is normal in size; the mediastinal contour is within normal limits. No acute osseous abnormalities are seen. IMPRESSION: No acute cardiopulmonary process seen. Electronically Signed   By: Roanna Raider M.D.   On: 02/13/2018 06:15   Ct Angio Neck W Or Wo Contrast  Result Date: 02/13/2018 CLINICAL DATA:  Follow up stroke. EXAM: CT ANGIOGRAPHY HEAD AND NECK TECHNIQUE: Multidetector CT imaging of the head and neck was performed using the standard protocol during bolus administration of intravenous contrast. Multiplanar CT image reconstructions and MIPs were obtained to evaluate the vascular anatomy. Carotid stenosis measurements (when applicable) are obtained utilizing NASCET criteria, using the  distal internal carotid diameter as the denominator. CONTRAST:  75mL ISOVUE-370 IOPAMIDOL (ISOVUE-370) INJECTION 76% COMPARISON:  CT HEAD February 12, 2018 and MRI/MRA head February 13, 2018. FINDINGS: CTA NECK FINDINGS: AORTIC ARCH: Normal appearance of the thoracic arch, normal branch pattern. The origins of the innominate, left Common carotid artery and subclavian artery are widely patent. RIGHT CAROTID SYSTEM: Common carotid artery is patent. Normal appearance of the carotid bifurcation without hemodynamically significant stenosis by NASCET criteria. Normal appearance of the internal carotid artery. LEFT CAROTID SYSTEM: Common carotid artery is patent. Normal appearance of the carotid bifurcation without hemodynamically significant stenosis by NASCET criteria. Normal appearance of the internal carotid artery. VERTEBRAL ARTERIES:Codominant vertebral arteries. Mild stenosis LEFT vertebral artery origin. Patent vertebral arteries, no dissection. SKELETON: No acute osseous process though bone windows have not been submitted. OTHER NECK: Soft tissues of the neck are nonacute though, not tailored for evaluation. UPPER CHEST: Included lung apices are clear. No superior mediastinal lymphadenopathy. CTA HEAD FINDINGS: ANTERIOR CIRCULATION: Patent cervical internal carotid arteries, petrous, cavernous and supra clinoid internal carotid arteries. 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 Patent anterior communicating artery. Patent anterior and middle cerebral arteries. No large vessel occlusion, significant stenosis, contrast extravasation . POSTERIOR CIRCULATION: Patent vertebral arteries, vertebrobasilar junction and basilar artery, as well as main branch vessels. Patent posterior cerebral arteries. Severe stenosis RIGHT P2 segment with indistinct appearance. Robust RIGHT posterior communicating artery present. No large vessel occlusion,  contrast extravasation or aneurysm. VENOUS SINUSES:  Major dural venous sinuses are patent though not tailored for evaluation on this angiographic examination. ANATOMIC VARIANTS: None. DELAYED PHASE: No  abnormal intracranial enhancement. MIP images reviewed. IMPRESSION: CTA NECK: 1. No hemodynamically significant stenosis ICA's. 2. Mild stenosis LEFT vertebral artery.  Patent vertebral arteries. CTA HEAD: 1. Flow-limiting stenosis distal RIGHT P2 segment, possible thromboembolism. 2. No emergent large vessel occlusion. 3. 2 mm RIGHT paraophthalmic intact aneurysm. Critical Value/emergent results text paged to Dr.ERIC [facility] via AMION secure system on 02/13/2018 at 6:01 am, including interpreting physician's phone number. Electronically Signed   By: Awilda Metro M.D.   On: 02/13/2018 06:02   Mr Maxine Glenn Head Wo Contrast  Result Date: 02/13/2018 CLINICAL DATA:  LEFT facial numbness radiating to LEFT arm tonight. EXAM: MRI HEAD WITHOUT CONTRAST MRA HEAD WITHOUT CONTRAST TECHNIQUE: Multiplanar, multiecho pulse sequences of the brain and surrounding structures were obtained without intravenous contrast. Angiographic images of the head were obtained using MRA technique without contrast. Axial T2 FLAIR, axial susceptibility weighted imaging, coronal T2 and axial T1 sequences not obtained. Patient did not wish to finish examination. COMPARISON:  CT HEAD February 12, 2018. FINDINGS: MRI HEAD FINDINGS INTRACRANIAL CONTENTS: 12 mm reduced diffusion RIGHT thalamus to cerebral peduncle with low ADC values patchy frontal parietal subcortical white matter T2 hyperintensities. The ventricles and sulci are normal for patient's age. No suspicious parenchymal signal, masses, mass effect. No abnormal extra-axial fluid collections. No extra-axial masses. VASCULAR: Normal major intracranial vascular flow voids present at skull base. SKULL AND UPPER CERVICAL SPINE: No abnormal sellar expansion. Included pituitary. No suspicious calvarial bone marrow signal. Craniocervical junction  maintained. SINUSES/ORBITS: Mild ethmoid sinusitis. The included ocular globes and orbital contents are non-suspicious. OTHER: None. MRA HEAD FINDINGS-moderately motion degraded images through the circle-of-Willis. ANTERIOR CIRCULATION: Normal flow related enhancement of the included cervical, petrous, cavernous and supraclinoid internal carotid arteries. Patent anterior communicating artery. Patent anterior and middle cerebral arteries. POSTERIOR CIRCULATION: Codominant vertebral arteries. Vertebrobasilar arteries are patent, with normal flow related enhancement of the main branch vessels. Patent posterior cerebral arteries. Robust RIGHT posterior communicating artery present. ANATOMIC VARIANTS: None. Source images and MIP images were reviewed. IMPRESSION: MRI HEAD: 1. 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 2. Patchy bifrontal parietal subcortical white matter T2 hyperintensities seen with vasculopathy, Lyme disease, less likely TBI or chronic small vessel ischemic changes. Recommend contrast enhanced images and/or 3 to six-month follow-up to establish stability of findings. MRA HEAD: 1. Motion degraded examination without emergent large vessel occlusion. Limited assessment of circle-of-Willis. 2. To assess for stenosis or vasculitis, CTA HEAD could be performed. Critical Value/emergent results text paged to Dr.ERIC [facility] via AMION secure system on 02/13/2018 at 2:32 am, including interpreting physician's phone number. Electronically Signed   By: Awilda Metro M.D.   On: 02/13/2018 02:32   Mr Brain Wo Contrast  Result Date: 02/13/2018 CLINICAL DATA:  LEFT facial numbness radiating to LEFT arm tonight. EXAM: MRI HEAD WITHOUT CONTRAST MRA HEAD WITHOUT CONTRAST TECHNIQUE: Multiplanar, multiecho pulse sequences of the brain and surrounding structures were obtained without intravenous contrast. Angiographic images of the head were obtained using  MRA technique without contrast. Axial T2 FLAIR, axial susceptibility weighted imaging, coronal T2 and axial T1 sequences not obtained. Patient did not wish to finish examination. COMPARISON:  CT HEAD February 12, 2018. FINDINGS: MRI HEAD FINDINGS INTRACRANIAL CONTENTS: 12 mm reduced diffusion RIGHT thalamus to cerebral peduncle with low ADC values patchy frontal parietal subcortical white matter T2 hyperintensities. The ventricles and sulci are normal for patient's age. No suspicious parenchymal signal, masses, mass effect. No abnormal extra-axial fluid  collections. No extra-axial masses. VASCULAR: Normal major intracranial vascular flow voids present at skull base. SKULL AND UPPER CERVICAL SPINE: No abnormal sellar expansion. Included pituitary. No suspicious calvarial bone marrow signal. Craniocervical junction maintained. SINUSES/ORBITS: Mild ethmoid sinusitis. The included ocular globes and orbital contents are non-suspicious. OTHER: None. MRA HEAD FINDINGS-moderately motion degraded images through the circle-of-Willis. ANTERIOR CIRCULATION: Normal flow related enhancement of the included cervical, petrous, cavernous and supraclinoid internal carotid arteries. Patent anterior communicating artery. Patent anterior and middle cerebral arteries. POSTERIOR CIRCULATION: Codominant vertebral arteries. Vertebrobasilar arteries are patent, with normal flow related enhancement of the main branch vessels. Patent posterior cerebral arteries. Robust RIGHT posterior communicating artery present. ANATOMIC VARIANTS: None. Source images and MIP images were reviewed. IMPRESSION: MRI HEAD: 1. 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 2. Patchy bifrontal parietal subcortical white matter T2 hyperintensities seen with vasculopathy, Lyme disease, less likely TBI or chronic small vessel ischemic changes. Recommend contrast enhanced images and/or 3 to six-month follow-up to  establish stability of findings. MRA HEAD: 1. Motion degraded examination without emergent large vessel occlusion. Limited assessment of circle-of-Willis. 2. To assess for stenosis or vasculitis, CTA HEAD could be performed. Critical Value/emergent results text paged to Dr.ERIC [facility] via AMION secure system on 02/13/2018 at 2:32 am, including interpreting physician's phone number. Electronically Signed   By: Awilda Metro M.D.   On: 02/13/2018 02:32   Ct Head Code Stroke Wo Contrast  Result Date: 02/12/2018 CLINICAL DATA:  Code stroke. LEFT arm and tongue numbness. Last seen normal at 8 a.m. EXAM: CT HEAD WITHOUT CONTRAST TECHNIQUE: Contiguous axial images were obtained from the base of the skull through the vertex without intravenous contrast. COMPARISON:  None. FINDINGS: BRAIN: No intraparenchymal hemorrhage, mass effect nor midline shift. Faint bifrontal subcortical white matter hypodensities. The ventricles and sulci are normal. No acute large vascular territory infarcts. No abnormal extra-axial fluid collections. Basal cisterns are patent. VASCULAR: Unremarkable. SKULL/SOFT TISSUES: No skull fracture. No significant soft tissue swelling. ORBITS/SINUSES: The included ocular globes and orbital contents are normal.Mild lobulated paranasal sinus mucosal thickening. Mastoid air cells are well aerated. OTHER: None. ASPECTS [facility] Stroke Program Early CT Score) - Ganglionic level infarction (caudate, lentiform nuclei, internal capsule, insula, M1-M3 cortex): 7 - Supraganglionic infarction (M4-M6 cortex): 3 Total score (0-10 with 10 being normal): 10 IMPRESSION: 1. No acute intracranial process. 2. ASPECTS is 10. 3. Bifrontal white matter changes seen with old shear injury, chronic small vessel ischemic changes or artifact. Findings would better characterized on MRI of the brain with and without contrast. 4. Critical Value/emergent results text paged to Dr.Lindzen, Neurology via AMION secure system on  02/12/2018 at 11:34 pm, including interpreting physician's phone number. Electronically Signed   By: Awilda Metro M.D.   On: 02/12/2018 23:34    Medications:  Scheduled: .  stroke: mapping our early stages of recovery book   Does not apply Once  . amitriptyline  100 mg Oral QHS  . aspirin  81 mg Oral Daily  . atorvastatin  40 mg Oral q1800  . enoxaparin (LOVENOX) injection  40 mg Subcutaneous Q24H  . [START ON 02/14/2018] ferrous gluconate  324 mg Oral Q breakfast  . pantoprazole  80 mg Oral Daily    Assessment/Plan:  Right thalamic ischemic infarct with left sided numbness and tingling, which has mostly resolved.  The only clear stroke risk factor is hypertension with BP 163/102 on arrival to ED.  Interestingly BP is OK  now without treatment.  She was non-compliant with BP med at home recently.  She is on statin now to reduce LDL below 70 mg/dl.  She is not diabetic and is a non-smoker.  There was no cocaine or amphetamines in her urine.  No evidence of a. Fib on telemetry and awaiting TTE results.  CTA brain and neck are normal. She has been started on ASA for secondary stroke prevention.  I will allow permissive hypertension for the next 24 hours, but will likely need to re-start her BP med prior to discharge.  I don't think that hypercoagulable work up is necessary.    Weston SettleShervin Jeannene Tschetter, MS, MD   LOS: 0 days   Weston SettleESHRAGHI, Tersa Fotopoulos, MD 02/13/2018  3:09 PM

## 2018-02-13 NOTE — ED Notes (Signed)
Heart Healthy Diet was ordered for Patient. 

## 2018-02-13 NOTE — H&P (Signed)
History and Physical    Carol SalinesSong Reinig RUE:[id number] DOB: [birth year] DOA: 02/12/2018  PCP: Nonnie DoneSlatosky, John J., MD  Patient coming from: Home  I have personally briefly [facility]  Chief Complaint: L face and arm numbness  HPI: Carol Sanders is a 42 y.o. female with medical history significant of previously healthy.  Patient presents to the ED with c/o L sided facial and L arm numbness.  Symptoms onset at 8pm.  Has h/o intermittent L sided numbness but never involved face.  Also c/o L sided visual blurring.   ED Course: Confirmed to have acute R thalamic infarct.  Also has B frontal white matter abnormality that seems more chronic (see MRI report).   Review of Systems: As per HPI otherwise 10 point review of systems negative.   Past Medical History:  Diagnosis Date  . Allergic rhinitis   . Recurrent upper respiratory infection (URI)     Past Surgical History:  Procedure Laterality Date  . ADENOIDECTOMY    . TONSILLECTOMY       reports that she has never smoked. She has never used smokeless tobacco. She reports that she does not drink alcohol or use drugs.  Allergies  Allergen Reactions  . Benadryl [Diphenhydramine Hcl (Sleep)] Cough    Family History  Problem Relation Age of Onset  . CAD Mother        just had CABG recently, in 7270s  . Stroke Mother        in 8050s patient thinks but isnt sure she had stroke     Prior to Admission medications   Medication Sig Start Date End Date Taking? Authorizing Provider  amitriptyline (ELAVIL) 100 MG tablet Take 100 mg by mouth at bedtime.   Yes [provider]  cetirizine (ZYRTEC) 10 MG tablet Take 10 mg by mouth daily.   Yes [provider]  losartan (COZAAR) 50 MG tablet TAKE 1 TABLET(50 MG) BY MOUTH DAILY Patient taking differently: Take 50 mg by mouth daily.  12/18/16  Yes Kozlow, Alvira PhilipsEric J, MD  omeprazole (PRILOSEC) 40 MG capsule Take one capsule every morning  before breakfast Patient taking differently: Take 40 mg by mouth daily.  05/15/16  Yes Kozlow, Alvira PhilipsEric J, MD  ranitidine (ZANTAC) 150 MG tablet Take two tablets at bedtime Patient taking differently: Take 300 mg by mouth at bedtime. Take two tablets at bedtime 05/15/16  Yes Kozlow, Alvira PhilipsEric J, MD    Physical Exam: Vitals:   02/13/18 0100 02/13/18 0300 02/13/18 0315 02/13/18 0330  BP:  (!) 153/103 124/82 118/87  Pulse: 95 75 73 71  Resp: (!) 25 20 20 18   SpO2: 98% 99% 98% 98%  Weight:      Height:        Constitutional: NAD, calm, comfortable Eyes: PERRL, lids and conjunctivae normal ENMT: Mucous membranes are moist. Posterior pharynx clear of any exudate or lesions.Normal dentition.  Neck: normal, supple, no masses, no thyromegaly Respiratory: clear to auscultation bilaterally, no wheezing, no crackles. Normal respiratory effort. No accessory muscle use.  Cardiovascular: Regular rate and rhythm, no murmurs / rubs / gallops. No extremity edema. 2+ pedal pulses. No carotid bruits.  Abdomen: no tenderness, no masses palpated. No hepatosplenomegaly. Bowel sounds positive.  Musculoskeletal: no clubbing / cyanosis. No joint deformity upper and lower extremities. Good ROM, no contractures. Normal muscle tone.  Skin: no rashes, lesions, ulcers. No induration Neurologic: CN 2-12 grossly intact. Sensation intact, DTR normal. Strength 5/5 in all 4.  Psychiatric: Normal judgment and insight. Alert and oriented x 3. Normal mood.    Labs on Admission: I have personally reviewed following labs and imaging studies  CBC: Recent Labs  Lab 02/12/18 2331 02/12/18 2339  WBC 6.5  --   NEUTROABS 2.9  --   HGB 7.8* 10.2*  HCT 28.2* 30.0*  MCV 61.0*  --   PLT 335  --    Basic Metabolic Panel: Recent Labs  Lab 02/12/18 2331 02/12/18 2339  NA 141 141  K 3.1* 3.1*  CL 105 104  CO2 25  --   GLUCOSE 108* 108*  BUN 11 12  CREATININE 0.57 0.50  CALCIUM 8.1*  --    GFR: Estimated Creatinine  Clearance: 70 mL/min (by C-G formula based on SCr of 0.5 mg/dL). Liver Function Tests: Recent Labs  Lab 02/12/18 2331  AST 14*  ALT 11  ALKPHOS 47  BILITOT 0.3  PROT 6.9  ALBUMIN 3.7   No results for input(s): LIPASE, AMYLASE in the last 168 hours. No results for input(s): AMMONIA in the last 168 hours. Coagulation Profile: Recent Labs  Lab 02/12/18 2331  INR 0.96   Cardiac Enzymes: No results for input(s): CKTOTAL, CKMB, CKMBINDEX, TROPONINI in the last 168 hours. BNP (last 3 results) No results for input(s): PROBNP in the last 8760 hours. HbA1C: No results for input(s): HGBA1C in the last 72 hours. CBG: No results for input(s): GLUCAP in the last 168 hours. Lipid Profile: No results for input(s): CHOL, HDL, LDLCALC, TRIG, CHOLHDL, LDLDIRECT in the last 72 hours. Thyroid Function Tests: No results for input(s): TSH, T4TOTAL, FREET4, T3FREE, THYROIDAB in the last 72 hours. Anemia Panel: Recent Labs    02/13/18 0432  RETICCTPCT 1.3   Urine analysis:    Component Value Date/Time   COLORURINE STRAW (A) 02/13/2018 0058   APPEARANCEUR CLEAR 02/13/2018 0058   LABSPEC 1.009 02/13/2018 0058   PHURINE 8.0 02/13/2018 0058   GLUCOSEU NEGATIVE 02/13/2018 0058   HGBUR SMALL (A) 02/13/2018 0058   BILIRUBINUR NEGATIVE 02/13/2018 0058   KETONESUR NEGATIVE 02/13/2018 0058   PROTEINUR NEGATIVE 02/13/2018 0058   NITRITE NEGATIVE 02/13/2018 0058   LEUKOCYTESUR NEGATIVE 02/13/2018 0058    Radiological Exams on Admission: Mr Maxine GlennMra Head Wo Contrast  Result Date: 02/13/2018 CLINICAL DATA:  LEFT facial numbness radiating to LEFT arm tonight. EXAM: MRI HEAD WITHOUT CONTRAST MRA HEAD WITHOUT CONTRAST TECHNIQUE: Multiplanar, multiecho pulse sequences of the brain and surrounding structures were obtained without intravenous contrast. Angiographic images of the head were obtained using MRA technique without contrast. Axial T2 FLAIR, axial susceptibility weighted imaging, coronal T2 and  axial T1 sequences not obtained. Patient did not wish to finish examination. COMPARISON:  CT HEAD February 12, 2018. FINDINGS: MRI HEAD FINDINGS INTRACRANIAL CONTENTS: 12 mm reduced diffusion RIGHT thalamus to cerebral peduncle with low ADC values patchy frontal parietal subcortical white matter T2 hyperintensities. The ventricles and sulci are normal for patient's age. No suspicious parenchymal signal, masses, mass effect. No abnormal extra-axial fluid collections. No extra-axial masses. VASCULAR: Normal major intracranial vascular flow voids present at skull base. SKULL AND UPPER CERVICAL SPINE: No abnormal sellar expansion. Included pituitary. No suspicious calvarial bone marrow signal. Craniocervical junction maintained. SINUSES/ORBITS: Mild ethmoid sinusitis. The included ocular globes and orbital contents are non-suspicious. OTHER: None. MRA HEAD FINDINGS-moderately motion degraded images through the circle-of-Willis. ANTERIOR CIRCULATION: Normal flow related enhancement of the included cervical, petrous, cavernous and supraclinoid internal carotid arteries. Patent anterior communicating artery. Patent anterior and middle cerebral  arteries. POSTERIOR CIRCULATION: Codominant vertebral arteries. Vertebrobasilar arteries are patent, with normal flow related enhancement of the main branch vessels. Patent posterior cerebral arteries. Robust RIGHT posterior communicating artery present. ANATOMIC VARIANTS: None. Source images and MIP images were reviewed. IMPRESSION: MRI HEAD: 1. 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 2. Patchy bifrontal parietal subcortical white matter T2 hyperintensities seen with vasculopathy, Lyme disease, less likely TBI or chronic small vessel ischemic changes. Recommend contrast enhanced images and/or 3 to six-month follow-up to establish stability of findings. MRA HEAD: 1. Motion degraded examination without emergent large vessel  occlusion. Limited assessment of circle-of-Willis. 2. To assess for stenosis or vasculitis, CTA HEAD could be performed. Critical Value/emergent results text paged to Dr.ERIC [facility] via AMION secure system on 02/13/2018 at 2:32 am, including interpreting physician's phone number. Electronically Signed   By: Awilda Metro M.D.   On: 02/13/2018 02:32   Mr Brain Wo Contrast  Result Date: 02/13/2018 CLINICAL DATA:  LEFT facial numbness radiating to LEFT arm tonight. EXAM: MRI HEAD WITHOUT CONTRAST MRA HEAD WITHOUT CONTRAST TECHNIQUE: Multiplanar, multiecho pulse sequences of the brain and surrounding structures were obtained without intravenous contrast. Angiographic images of the head were obtained using MRA technique without contrast. Axial T2 FLAIR, axial susceptibility weighted imaging, coronal T2 and axial T1 sequences not obtained. Patient did not wish to finish examination. COMPARISON:  CT HEAD February 12, 2018. FINDINGS: MRI HEAD FINDINGS INTRACRANIAL CONTENTS: 12 mm reduced diffusion RIGHT thalamus to cerebral peduncle with low ADC values patchy frontal parietal subcortical white matter T2 hyperintensities. The ventricles and sulci are normal for patient's age. No suspicious parenchymal signal, masses, mass effect. No abnormal extra-axial fluid collections. No extra-axial masses. VASCULAR: Normal major intracranial vascular flow voids present at skull base. SKULL AND UPPER CERVICAL SPINE: No abnormal sellar expansion. Included pituitary. No suspicious calvarial bone marrow signal. Craniocervical junction maintained. SINUSES/ORBITS: Mild ethmoid sinusitis. The included ocular globes and orbital contents are non-suspicious. OTHER: None. MRA HEAD FINDINGS-moderately motion degraded images through the circle-of-Willis. ANTERIOR CIRCULATION: Normal flow related enhancement of the included cervical, petrous, cavernous and supraclinoid internal carotid arteries. Patent anterior communicating artery. Patent  anterior and middle cerebral arteries. POSTERIOR CIRCULATION: Codominant vertebral arteries. Vertebrobasilar arteries are patent, with normal flow related enhancement of the main branch vessels. Patent posterior cerebral arteries. Robust RIGHT posterior communicating artery present. ANATOMIC VARIANTS: None. Source images and MIP images were reviewed. IMPRESSION: MRI HEAD: 1. 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 2. Patchy bifrontal parietal subcortical white matter T2 hyperintensities seen with vasculopathy, Lyme disease, less likely TBI or chronic small vessel ischemic changes. Recommend contrast enhanced images and/or 3 to six-month follow-up to establish stability of findings. MRA HEAD: 1. Motion degraded examination without emergent large vessel occlusion. Limited assessment of circle-of-Willis. 2. To assess for stenosis or vasculitis, CTA HEAD could be performed. Critical Value/emergent results text paged to Dr.ERIC [facility] via AMION secure system on 02/13/2018 at 2:32 am, including interpreting physician's phone number. Electronically Signed   By: Awilda Metro M.D.   On: 02/13/2018 02:32   Ct Head Code Stroke Wo Contrast  Result Date: 02/12/2018 CLINICAL DATA:  Code stroke. LEFT arm and tongue numbness. Last seen normal at 8 a.m. EXAM: CT HEAD WITHOUT CONTRAST TECHNIQUE: Contiguous axial images were obtained from the base of the skull through the vertex without intravenous contrast. COMPARISON:  None. FINDINGS: BRAIN: No intraparenchymal hemorrhage, mass effect  nor midline shift. Faint bifrontal subcortical white matter hypodensities. The ventricles and sulci are normal. No acute large vascular territory infarcts. No abnormal extra-axial fluid collections. Basal cisterns are patent. VASCULAR: Unremarkable. SKULL/SOFT TISSUES: No skull fracture. No significant soft tissue swelling. ORBITS/SINUSES: The included ocular globes and orbital  contents are normal.Mild lobulated paranasal sinus mucosal thickening. Mastoid air cells are well aerated. OTHER: None. ASPECTS [facility] Stroke Program Early CT Score) - Ganglionic level infarction (caudate, lentiform nuclei, internal capsule, insula, M1-M3 cortex): 7 - Supraganglionic infarction (M4-M6 cortex): 3 Total score (0-10 with 10 being normal): 10 IMPRESSION: 1. No acute intracranial process. 2. ASPECTS is 10. 3. Bifrontal white matter changes seen with old shear injury, chronic small vessel ischemic changes or artifact. Findings would better characterized on MRI of the brain with and without contrast. 4. Critical Value/emergent results text paged to Dr.Lindzen, Neurology via AMION secure system on 02/12/2018 at 11:34 pm, including interpreting physician's phone number. Electronically Signed   By: Awilda Metro M.D.   On: 02/12/2018 23:34    EKG: Independently reviewed.  Assessment/Plan Principal Problem:   Acute ischemic stroke [facility]) Active Problems:   Microcytic hypochromic anemia   HTN (hypertension)   White matter abnormality on MRI of brain    1. Acute ischemic stroke - 1. Stroke pathway 2. CTA head and neck 1. Rule out vasculitis 3. 2d echo 4. CXR 5. Replace K 6. Tele monitor 7. A1C FLP 8. ASA 650 load and 81mg  daily per Dr. Otelia Limes 9. PT/OT/SLP 2. Bifrontal white matter changes - 1. CTA being performed to look for vasculitis 2. Defer need for Lyme work up to stroke team 1. I haven't heard of Lyme causing acute ischemic stroke before though. 2. Small vessel disease or vasculitis seems more likely. 3. May also need follow up MRI 3. HTN - 1. Holding home BP meds and allowing permissive HTN in setting of acute ischemic stroke. 4. Microcytic hypochromic anemia - 1. Anemia pnl pending  DVT prophylaxis: Lovenox Code Status: Full Family Communication: Family at bedside Disposition Plan: Home after admit Consults called: Neuro Admission status: Admit to  inpatient  Severity of Illness: The appropriate patient status for this patient is INPATIENT. Inpatient status is judged to be reasonable and necessary in order to provide the required intensity of service to ensure the patient's safety. The patient's presenting symptoms, physical exam findings, and initial radiographic and laboratory data in the context of their chronic comorbidities is felt to place them at high risk for further clinical deterioration. Furthermore, it is not anticipated that the patient will be medically stable for discharge from the hospital within 2 midnights of admission. The following factors support the patient status of inpatient.   " The patient's presenting symptoms include Numbness. " The worrisome physical exam findings include L face and arm numbness. " The initial radiographic and laboratory data are worrisome because of 12mm R thalamic acute stroke!   * I certify that at the point of admission it is my clinical judgment that the patient will require inpatient hospital care spanning beyond 2 midnights from the point of admission due to high intensity of service, high risk for further deterioration and high frequency of surveillance required.Hillary Bow DO [facility] Pager [phone] Only works nights!  If 7AM-7PM, please contact the primary day team physician taking care of patient  [email or web address] Password [facility]  02/13/2018, 5:19 AM

## 2018-02-13 NOTE — ED Notes (Signed)
Called echo to make sure pt was scheduled for echo. Aurther Lofterry stated the ptt is on their list, but they said they usually do the echo on the floor, not the ED.

## 2018-02-13 NOTE — Progress Notes (Signed)
Patient seen and examined this morning, admitted earlier today by Dr. Julian ReilGardner, H&P reviewed and agree with the assessment and plan.  In brief, this is a 42 year old female with no significant past medical history came in to the hospital with left-sided facial and left arm numbness, MRI was positive for a stroke.  Neurology was consulted and she was placed on aspirin   Acute CVA -Appreciate stroke team follow-up, MRI showed an acute 12 mm right thalamus to cerebral peduncle nonhemorrhagic infarct, underwent a CT angiogram of the head and neck which showed possible thromboembolism on right P2 segment.  A1c is 5.4, lipid panel shows an LDL of 112.  Start statin.  2D echo pending  Iron deficiency anemia -Start iron supplements on discharge   Costin M. Elvera LennoxGherghe, MD, PhD [facility] Pager [phone]  If 7PM-7AM, please contact night-coverage [email or web address] Password [email or web address]

## 2018-02-13 NOTE — ED Notes (Signed)
Pt transported to CT ?

## 2018-02-13 NOTE — Progress Notes (Signed)
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 This is most likely the etiology for the right thalamic lacunar infarction. Out of IV tPA window. The finding is not intervenable via catheter-based approach as NIHSS is 0. She received 650 mg ASA earlier this shift.   Electronically signed: Dr. Caryl PinaEric Jaydalynn Olivero

## 2018-02-13 NOTE — ED Notes (Signed)
Received report from MRI that pt was unable to complete test due to nasal congestion and inability to breathe through nose. MD aware

## 2018-02-14 ENCOUNTER — Inpatient Hospital Stay (HOSPITAL_BASED_OUTPATIENT_CLINIC_OR_DEPARTMENT_OTHER): Payer: BLUE CROSS/BLUE SHIELD

## 2018-02-14 ENCOUNTER — Inpatient Hospital Stay (HOSPITAL_COMMUNITY): Payer: BLUE CROSS/BLUE SHIELD

## 2018-02-14 DIAGNOSIS — I6389 Other cerebral infarction: Secondary | ICD-10-CM

## 2018-02-14 DIAGNOSIS — I639 Cerebral infarction, unspecified: Secondary | ICD-10-CM | POA: Diagnosis not present

## 2018-02-14 LAB — C-REACTIVE PROTEIN

## 2018-02-14 LAB — ECHOCARDIOGRAM COMPLETE
Height: 63 in
Weight: 1744 oz

## 2018-02-14 LAB — SEDIMENTATION RATE: Sed Rate: 4 mm/hr (ref 0–22)

## 2018-02-14 LAB — RPR: RPR Ser Ql: NONREACTIVE

## 2018-02-14 LAB — HIV ANTIBODY (ROUTINE TESTING W REFLEX): HIV Screen 4th Generation wRfx: NONREACTIVE

## 2018-02-14 LAB — ANTI-DNA ANTIBODY, DOUBLE-STRANDED: DS DNA AB: 1 [IU]/mL (ref 0–9)

## 2018-02-14 MED ORDER — LOSARTAN POTASSIUM 50 MG PO TABS: 50.0000 mg | ORAL_TABLET | Freq: Every day | ORAL | 2 refills | Status: DC

## 2018-02-14 MED ORDER — ACETAMINOPHEN 325 MG PO TABS: 650.0000 mg | ORAL_TABLET | ORAL | 1 refills | Status: DC | PRN

## 2018-02-14 MED ORDER — ASPIRIN 81 MG PO CHEW: 81.0000 mg | CHEWABLE_TABLET | Freq: Every day | ORAL | 0 refills | Status: DC

## 2018-02-14 MED ORDER — ATORVASTATIN CALCIUM 40 MG PO TABS: 40.0000 mg | ORAL_TABLET | Freq: Every day | ORAL | 2 refills | Status: DC

## 2018-02-14 MED ORDER — CLOPIDOGREL BISULFATE 75 MG PO TABS: 75.0000 mg | ORAL_TABLET | Freq: Every day | ORAL | 4 refills | Status: AC

## 2018-02-14 MED ORDER — FERROUS GLUCONATE 324 (38 FE) MG PO TABS: 324.0000 mg | ORAL_TABLET | Freq: Every day | ORAL | 3 refills | Status: DC

## 2018-02-14 NOTE — Discharge Instructions (Signed)
1)Take aspirin 81 and Plavix 75 mg  together for 3 weeks then after 3 weeks , STOP the aspirin but continue to Plavix 75 mg daily indefinitely  2)You are taking Aspirin and Plavix which are blood thinners so Avoid ibuprofen/Advil/Aleve/Motrin/Goody Powders/Naproxen/BC powders/Meloxicam/Diclofenac/Indomethacin and other Nonsteroidal anti-inflammatory medications as these will make you more likely to bleed and can cause stomach ulcers, can also cause Kidney problems.   3) call and make an appointment for follow-up neurology to try to schedule transcranial Dopplers and/or TEE Tests and other investigations to try to figure out why you have a stroke-- [facility] Address: [address], [place], [place] [address]  Phone: [phone]

## 2018-02-14 NOTE — Progress Notes (Signed)
  Echocardiogram 2D Echocardiogram has been performed.  Carol Sanders, Carol Sanders 02/14/2018, 9:48 AM

## 2018-02-14 NOTE — Discharge Summary (Signed)
Carol Sanders, is a 42 y.o. female  DOB 01-Jul-1975  MRN [id number].  Admission date:  02/12/2018  Admitting Physician  Hillary Bow, DO  Discharge Date:  02/14/2018   Primary MD  Nonnie Done., MD  Recommendations for primary care physician for things to follow:   1)Take aspirin 81 and Plavix 75 mg  together for 3 weeks then after 3 weeks , STOP the aspirin but continue to Plavix 75 mg daily indefinitely  2)You are taking Aspirin and Plavix which are blood thinners so Avoid ibuprofen/Advil/Aleve/Motrin/Goody Powders/Naproxen/BC powders/Meloxicam/Diclofenac/Indomethacin and other Nonsteroidal anti-inflammatory medications as these will make you more likely to bleed and can cause stomach ulcers, can also cause Kidney problems.   3) call and make an appointment for follow-up neurology to try to schedule transcranial Dopplers and/or TEE Tests and other investigations to try to figure out why you have a stroke-- [facility] Address: [address] [place], [place] [address]  Phone: [phone]     Admission Diagnosis  Stroke [facility]) [I63.9] Cerebrovascular accident (CVA), unspecified mechanism (HCC) [I63.9] Anemia, unspecified type [D64.9]   Discharge Diagnosis  Stroke [facility]) [I63.9] Cerebrovascular accident (CVA), unspecified mechanism (HCC) [I63.9] Anemia, unspecified type [D64.9]    Principal Problem:   Acute ischemic stroke (HCC) Active Problems:   Microcytic hypochromic anemia   HTN (hypertension)   White matter abnormality on MRI of brain      Past Medical History:  Diagnosis Date  . Allergic rhinitis   . Recurrent upper respiratory infection (URI)     Past Surgical History:  Procedure Laterality Date  . ADENOIDECTOMY    . TONSILLECTOMY         HPI  from the history and physical done on the day of admission:    Chief Complaint: L face and arm numbness  HPI:  Carol Sanders is a 42 y.o. female with medical history significant of previously healthy.  Patient presents to the ED with c/o L sided facial and L arm numbness.  Symptoms onset at 8pm.  Has h/o intermittent L sided numbness but never involved face.  Also c/o L sided visual blurring.   ED Course: Confirmed to have acute R thalamic infarct.  Also has B frontal white matter abnormality that seems more chronic (see MRI report).   Hospital Course:    1)Acute CVA -Appreciate stroke team follow-up, MRI showed an acute 12 mm right thalamus to cerebral peduncle nonhemorrhagic infarct, underwent a CT angiogram of the head and neck which showed possible thromboembolism on right P2 segment.  A1c is 5.4, lipid panel shows an LDL of 112.    Discharged home on Lipitor.  2D echo with preserved EF and grade 2 diastolic dysfunction, no intracardiac thrombus,... Lower extremity Dopplers without acute DVT )Take aspirin 81 and Plavix 75 mg  together for 3 weeks then after 3 weeks , STOP the aspirin but continue to Plavix 75 mg daily indefinitely Follow-up with for neurology associates for Ree-valuation and possibly to schedule  transcranial Dopplers and/or TEE Tests  and other investigations to try to figure out why you have a stroke-- [facility] Address: [address] [place], [place] [address]  Phone: [phone]   2)Iron deficiency anemia -Start iron supplements on discharge  3)HTN--- stable, restart losartan  Discharge Condition: stable  Follow UP   Consults obtained - Neuro  Diet and Activity recommendation:  As advised  Discharge Instructions    Discharge Instructions    Call MD for:  difficulty breathing, headache or visual disturbances   Complete by:  As directed    Call MD for:  persistant dizziness or light-headedness   Complete by:  As directed    Call MD for:  persistant nausea and vomiting   Complete by:  As directed    Call MD for:  severe uncontrolled pain    Complete by:  As directed    Call MD for:  temperature >100.4   Complete by:  As directed    Diet - low sodium heart healthy   Complete by:  As directed    Discharge instructions   Complete by:  As directed    1)Take aspirin 81 and Plavix 75 mg  together for 3 weeks then after 3 weeks , STOP the aspirin but continue to Plavix 75 mg daily indefinitely  2)You are taking Aspirin and Plavix which are blood thinners so Avoid ibuprofen/Advil/Aleve/Motrin/Goody Powders/Naproxen/BC powders/Meloxicam/Diclofenac/Indomethacin and other Nonsteroidal anti-inflammatory medications as these will make you more likely to bleed and can cause stomach ulcers, can also cause Kidney problems.   3) call and make an appointment for follow-up neurology to try to schedule transcranial Dopplers and/or TEE Tests and other investigations to try to figure out why you have a stroke-- [facility] Address: [address] [place], [place] [address]  Phone: [phone]                                                             Phone: [phone]   Increase activity slowly   Complete by:  As directed         Discharge Medications     Allergies as of 02/14/2018      Reactions   Benadryl [diphenhydramine Hcl (sleep)] Cough      Medication List    STOP taking these medications   ranitidine 150 MG tablet Commonly known as:  ZANTAC     TAKE these medications   acetaminophen 325 MG tablet Commonly known as:  TYLENOL Take 2 tablets (650 mg total) by mouth every 4 (four) hours as needed for mild pain (or temp > 37.5 C (99.5 F)).   amitriptyline 10 MG tablet Commonly known as:  ELAVIL Take 10 mg by mouth at bedtime as needed for sleep.   aspirin 81 MG chewable tablet Chew 1 tablet (81 mg total) by mouth daily with breakfast. Take aspirin 81 and Plavix 75 mg  together for 3 weeks then after 3 weeks , STOP the aspirin but continue to Plavix 75 mg  daily indefinitely   atorvastatin 40 MG tablet Commonly known as:  LIPITOR Take 1 tablet (40 mg total) by mouth daily at 6 PM. Start taking on:  February 15, 2018   cetirizine 10 MG tablet Commonly known as:  ZYRTEC Take 10 mg by mouth daily.  clopidogrel 75 MG tablet Commonly known as:  PLAVIX Take 1 tablet (75 mg total) by mouth daily. Take aspirin 81 and Plavix 75 mg  together for 3 weeks then after 3 weeks , STOP the aspirin but continue to Plavix 75 mg daily indefinitely   ferrous gluconate 324 MG tablet Commonly known as:  FERGON Take 1 tablet (324 mg total) by mouth daily with breakfast. Start taking on:  February 15, 2018   losartan 50 MG tablet Commonly known as:  COZAAR Take 1 tablet (50 mg total) by mouth daily. What changed:  See the new instructions.   omeprazole 40 MG capsule Commonly known as:  PRILOSEC Take one capsule every morning before breakfast What changed:    how much to take  how to take this  when to take this  additional instructions       Major procedures and Radiology Reports - PLEASE review detailed and final reports for all details, in brief -    Ct Angio Head W Or Wo Contrast  Result Date: 02/13/2018 CLINICAL DATA:  Follow up stroke. EXAM: CT ANGIOGRAPHY HEAD AND NECK TECHNIQUE: Multidetector CT imaging of the head and neck was performed using the standard protocol during bolus administration of intravenous contrast. Multiplanar CT image reconstructions and MIPs were obtained to evaluate the vascular anatomy. Carotid stenosis measurements (when applicable) are obtained utilizing NASCET criteria, using the distal internal carotid diameter as the denominator. CONTRAST:  75mL ISOVUE-370 IOPAMIDOL (ISOVUE-370) INJECTION 76% COMPARISON:  CT HEAD February 12, 2018 and MRI/MRA head February 13, 2018. FINDINGS: CTA NECK FINDINGS: AORTIC ARCH: Normal appearance of the thoracic arch, normal branch pattern. The origins of the innominate, left Common  carotid artery and subclavian artery are widely patent. RIGHT CAROTID SYSTEM: Common carotid artery is patent. Normal appearance of the carotid bifurcation without hemodynamically significant stenosis by NASCET criteria. Normal appearance of the internal carotid artery. LEFT CAROTID SYSTEM: Common carotid artery is patent. Normal appearance of the carotid bifurcation without hemodynamically significant stenosis by NASCET criteria. Normal appearance of the internal carotid artery. VERTEBRAL ARTERIES:Codominant vertebral arteries. Mild stenosis LEFT vertebral artery origin. Patent vertebral arteries, no dissection. SKELETON: No acute osseous process though bone windows have not been submitted. OTHER NECK: Soft tissues of the neck are nonacute though, not tailored for evaluation. UPPER CHEST: Included lung apices are clear. No superior mediastinal lymphadenopathy. CTA HEAD FINDINGS: ANTERIOR CIRCULATION: Patent cervical internal carotid arteries, petrous, cavernous and supra clinoid internal carotid arteries. 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 Patent anterior communicating artery. Patent anterior and middle cerebral arteries. No large vessel occlusion, significant stenosis, contrast extravasation . POSTERIOR CIRCULATION: Patent vertebral arteries, vertebrobasilar junction and basilar artery, as well as main branch vessels. Patent posterior cerebral arteries. Severe stenosis RIGHT P2 segment with indistinct appearance. Robust RIGHT posterior communicating artery present. No large vessel occlusion,  contrast extravasation or aneurysm. VENOUS SINUSES: Major dural venous sinuses are patent though not tailored for evaluation on this angiographic examination. ANATOMIC VARIANTS: None. DELAYED PHASE: No abnormal intracranial enhancement. MIP images reviewed. IMPRESSION: CTA NECK: 1. No hemodynamically significant stenosis ICA's. 2. Mild stenosis LEFT vertebral artery.  Patent vertebral  arteries. CTA HEAD: 1. Flow-limiting stenosis distal RIGHT P2 segment, possible thromboembolism. 2. No emergent large vessel occlusion. 3. 2 mm RIGHT paraophthalmic intact aneurysm. Critical Value/emergent results text paged to Dr.ERIC [facility] via AMION secure system on 02/13/2018 at 6:01 am, including interpreting physician's phone number. Electronically Signed   By: Michel Santee.D.  On: 02/13/2018 06:02   Dg Chest 2 View  Result Date: 02/13/2018 CLINICAL DATA:  Acute onset of bilateral hand, lip and facial numbness. EXAM: CHEST - 2 VIEW COMPARISON:  Chest radiograph performed 05/15/2016 FINDINGS: The lungs are well-aerated and clear. There is no evidence of focal opacification, pleural effusion or pneumothorax. The heart is normal in size; the mediastinal contour is within normal limits. No acute osseous abnormalities are seen. IMPRESSION: No acute cardiopulmonary process seen. Electronically Signed   By: Roanna Raider M.D.   On: 02/13/2018 06:15   Ct Angio Neck W Or Wo Contrast  Result Date: 02/13/2018 CLINICAL DATA:  Follow up stroke. EXAM: CT ANGIOGRAPHY HEAD AND NECK TECHNIQUE: Multidetector CT imaging of the head and neck was performed using the standard protocol during bolus administration of intravenous contrast. Multiplanar CT image reconstructions and MIPs were obtained to evaluate the vascular anatomy. Carotid stenosis measurements (when applicable) are obtained utilizing NASCET criteria, using the distal internal carotid diameter as the denominator. CONTRAST:  75mL ISOVUE-370 IOPAMIDOL (ISOVUE-370) INJECTION 76% COMPARISON:  CT HEAD February 12, 2018 and MRI/MRA head February 13, 2018. FINDINGS: CTA NECK FINDINGS: AORTIC ARCH: Normal appearance of the thoracic arch, normal branch pattern. The origins of the innominate, left Common carotid artery and subclavian artery are widely patent. RIGHT CAROTID SYSTEM: Common carotid artery is patent. Normal appearance of the carotid bifurcation  without hemodynamically significant stenosis by NASCET criteria. Normal appearance of the internal carotid artery. LEFT CAROTID SYSTEM: Common carotid artery is patent. Normal appearance of the carotid bifurcation without hemodynamically significant stenosis by NASCET criteria. Normal appearance of the internal carotid artery. VERTEBRAL ARTERIES:Codominant vertebral arteries. Mild stenosis LEFT vertebral artery origin. Patent vertebral arteries, no dissection. SKELETON: No acute osseous process though bone windows have not been submitted. OTHER NECK: Soft tissues of the neck are nonacute though, not tailored for evaluation. UPPER CHEST: Included lung apices are clear. No superior mediastinal lymphadenopathy. CTA HEAD FINDINGS: ANTERIOR CIRCULATION: Patent cervical internal carotid arteries, petrous, cavernous and supra clinoid internal carotid arteries. 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 Patent anterior communicating artery. Patent anterior and middle cerebral arteries. No large vessel occlusion, significant stenosis, contrast extravasation . POSTERIOR CIRCULATION: Patent vertebral arteries, vertebrobasilar junction and basilar artery, as well as main branch vessels. Patent posterior cerebral arteries. Severe stenosis RIGHT P2 segment with indistinct appearance. Robust RIGHT posterior communicating artery present. No large vessel occlusion,  contrast extravasation or aneurysm. VENOUS SINUSES: Major dural venous sinuses are patent though not tailored for evaluation on this angiographic examination. ANATOMIC VARIANTS: None. DELAYED PHASE: No abnormal intracranial enhancement. MIP images reviewed. IMPRESSION: CTA NECK: 1. No hemodynamically significant stenosis ICA's. 2. Mild stenosis LEFT vertebral artery.  Patent vertebral arteries. CTA HEAD: 1. Flow-limiting stenosis distal RIGHT P2 segment, possible thromboembolism. 2. No emergent large vessel occlusion. 3. 2 mm RIGHT paraophthalmic  intact aneurysm. Critical Value/emergent results text paged to Dr.ERIC [facility] via AMION secure system on 02/13/2018 at 6:01 am, including interpreting physician's phone number. Electronically Signed   By: Awilda Metro M.D.   On: 02/13/2018 06:02   Mr Maxine Glenn Head Wo Contrast  Result Date: 02/13/2018 CLINICAL DATA:  LEFT facial numbness radiating to LEFT arm tonight. EXAM: MRI HEAD WITHOUT CONTRAST MRA HEAD WITHOUT CONTRAST TECHNIQUE: Multiplanar, multiecho pulse sequences of the brain and surrounding structures were obtained without intravenous contrast. Angiographic images of the head were obtained using MRA technique without contrast. Axial T2 FLAIR, axial susceptibility weighted imaging, coronal T2 and axial T1  sequences not obtained. Patient did not wish to finish examination. COMPARISON:  CT HEAD February 12, 2018. FINDINGS: MRI HEAD FINDINGS INTRACRANIAL CONTENTS: 12 mm reduced diffusion RIGHT thalamus to cerebral peduncle with low ADC values patchy frontal parietal subcortical white matter T2 hyperintensities. The ventricles and sulci are normal for patient's age. No suspicious parenchymal signal, masses, mass effect. No abnormal extra-axial fluid collections. No extra-axial masses. VASCULAR: Normal major intracranial vascular flow voids present at skull base. SKULL AND UPPER CERVICAL SPINE: No abnormal sellar expansion. Included pituitary. No suspicious calvarial bone marrow signal. Craniocervical junction maintained. SINUSES/ORBITS: Mild ethmoid sinusitis. The included ocular globes and orbital contents are non-suspicious. OTHER: None. MRA HEAD FINDINGS-moderately motion degraded images through the circle-of-Willis. ANTERIOR CIRCULATION: Normal flow related enhancement of the included cervical, petrous, cavernous and supraclinoid internal carotid arteries. Patent anterior communicating artery. Patent anterior and middle cerebral arteries. POSTERIOR CIRCULATION: Codominant vertebral arteries.  Vertebrobasilar arteries are patent, with normal flow related enhancement of the main branch vessels. Patent posterior cerebral arteries. Robust RIGHT posterior communicating artery present. ANATOMIC VARIANTS: None. Source images and MIP images were reviewed. IMPRESSION: MRI HEAD: 1. 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 2. Patchy bifrontal parietal subcortical white matter T2 hyperintensities seen with vasculopathy, Lyme disease, less likely TBI or chronic small vessel ischemic changes. Recommend contrast enhanced images and/or 3 to six-month follow-up to establish stability of findings. MRA HEAD: 1. Motion degraded examination without emergent large vessel occlusion. Limited assessment of circle-of-Willis. 2. To assess for stenosis or vasculitis, CTA HEAD could be performed. Critical Value/emergent results [facility] via AMION secure system on 02/13/2018 at 2:32 am, including interpreting physician's phone number. Electronically Signed   By: Awilda Metroourtnay  Bloomer M.D.   On: 02/13/2018 02:32   Mr Brain Wo Contrast  Result Date: 02/13/2018 CLINICAL DATA:  LEFT facial numbness radiating to LEFT arm tonight. EXAM: MRI HEAD WITHOUT CONTRAST MRA HEAD WITHOUT CONTRAST TECHNIQUE: Multiplanar, multiecho pulse sequences of the brain and surrounding structures were obtained without intravenous contrast. Angiographic images of the head were obtained using MRA technique without contrast. Axial T2 FLAIR, axial susceptibility weighted imaging, coronal T2 and axial T1 sequences not obtained. Patient did not wish to finish examination. COMPARISON:  CT HEAD February 12, 2018. FINDINGS: MRI HEAD FINDINGS INTRACRANIAL CONTENTS: 12 mm reduced diffusion RIGHT thalamus to cerebral peduncle with low ADC values patchy frontal parietal subcortical white matter T2 hyperintensities. The ventricles and sulci are normal for patient's age. No suspicious  parenchymal signal, masses, mass effect. No abnormal extra-axial fluid collections. No extra-axial masses. VASCULAR: Normal major intracranial vascular flow voids present at skull base. SKULL AND UPPER CERVICAL SPINE: No abnormal sellar expansion. Included pituitary. No suspicious calvarial bone marrow signal. Craniocervical junction maintained. SINUSES/ORBITS: Mild ethmoid sinusitis. The included ocular globes and orbital contents are non-suspicious. OTHER: None. MRA HEAD FINDINGS-moderately motion degraded images through the circle-of-Willis. ANTERIOR CIRCULATION: Normal flow related enhancement of the included cervical, petrous, cavernous and supraclinoid internal carotid arteries. Patent anterior communicating artery. Patent anterior and middle cerebral arteries. POSTERIOR CIRCULATION: Codominant vertebral arteries. Vertebrobasilar arteries are patent, with normal flow related enhancement of the main branch vessels. Patent posterior cerebral arteries. Robust RIGHT posterior communicating artery present. ANATOMIC VARIANTS: None. Source images and MIP images were reviewed. IMPRESSION: MRI HEAD: 1. 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 2. Patchy bifrontal parietal subcortical white matter T2 hyperintensities seen with vasculopathy,  Lyme disease, less likely TBI or chronic small vessel ischemic changes. Recommend contrast enhanced images and/or 3 to six-month follow-up to establish stability of findings. MRA HEAD: 1. Motion degraded examination without emergent large vessel occlusion. Limited assessment of circle-of-Willis. 2. To assess for stenosis or vasculitis, CTA HEAD could be performed. Critical Value/emergent results text paged to Dr.ERIC [facility] via AMION secure system on 02/13/2018 at 2:32 am, including interpreting physician's phone number. Electronically Signed   By: Awilda Metro M.D.   On: 02/13/2018 02:32   Ct Head Code Stroke Wo  Contrast  Result Date: 02/12/2018 CLINICAL DATA:  Code stroke. LEFT arm and tongue numbness. Last seen normal at 8 a.m. EXAM: CT HEAD WITHOUT CONTRAST TECHNIQUE: Contiguous axial images were obtained from the base of the skull through the vertex without intravenous contrast. COMPARISON:  None. FINDINGS: BRAIN: No intraparenchymal hemorrhage, mass effect nor midline shift. Faint bifrontal subcortical white matter hypodensities. The ventricles and sulci are normal. No acute large vascular territory infarcts. No abnormal extra-axial fluid collections. Basal cisterns are patent. VASCULAR: Unremarkable. SKULL/SOFT TISSUES: No skull fracture. No significant soft tissue swelling. ORBITS/SINUSES: The included ocular globes and orbital contents are normal.Mild lobulated paranasal sinus mucosal thickening. Mastoid air cells are well aerated. OTHER: None. ASPECTS [facility] Stroke Program Early CT Score) - Ganglionic level infarction (caudate, lentiform nuclei, internal capsule, insula, M1-M3 cortex): 7 - Supraganglionic infarction (M4-M6 cortex): 3 Total score (0-10 with 10 being normal): 10 IMPRESSION: 1. No acute intracranial process. 2. ASPECTS is 10. 3. Bifrontal white matter changes seen with old shear injury, chronic small vessel ischemic changes or artifact. Findings would better characterized on MRI of the brain with and without contrast. 4. Critical Value/emergent results text paged to Dr.Lindzen, Neurology via AMION secure system on 02/12/2018 at 11:34 pm, including interpreting physician's phone number. Electronically Signed   By: Awilda Metro M.D.   On: 02/12/2018 23:34   Vas [place] Lower Extremity Venous (dvt)  Result Date: 02/14/2018  Lower Venous Study Indications: Stroke.  Comparison Study: No prior study on file Performing Technologist: Sherren Kerns RVS  Examination Guidelines: A complete evaluation includes B-mode imaging, spectral Doppler, color Doppler, and power Doppler as needed of all  accessible portions of each vessel. Bilateral testing is considered an integral part of a complete examination. Limited examinations for reoccurring indications may be performed as noted.  Right Venous Findings: +---------+---------------+---------+-----------+----------+-------+          CompressibilityPhasicitySpontaneityPropertiesSummary +---------+---------------+---------+-----------+----------+-------+ CFV      Full           Yes      Yes                          +---------+---------------+---------+-----------+----------+-------+ SFJ      Full                                                 +---------+---------------+---------+-----------+----------+-------+ FV Prox  Full                                                 +---------+---------------+---------+-----------+----------+-------+ FV Mid   Full                                                 +---------+---------------+---------+-----------+----------+-------+  FV DistalFull                                                 +---------+---------------+---------+-----------+----------+-------+ PFV      Full                                                 +---------+---------------+---------+-----------+----------+-------+ POP      Full           Yes      Yes                          +---------+---------------+---------+-----------+----------+-------+ PTV      Full                                                 +---------+---------------+---------+-----------+----------+-------+ PERO     Full                                                 +---------+---------------+---------+-----------+----------+-------+  Left Venous Findings: +---------+---------------+---------+-----------+----------+-------+          CompressibilityPhasicitySpontaneityPropertiesSummary +---------+---------------+---------+-----------+----------+-------+ CFV      Full           Yes      Yes                           +---------+---------------+---------+-----------+----------+-------+ SFJ      Full                                                 +---------+---------------+---------+-----------+----------+-------+ FV Prox  Full                                                 +---------+---------------+---------+-----------+----------+-------+ FV Mid   Full                                                 +---------+---------------+---------+-----------+----------+-------+ FV DistalFull                                                 +---------+---------------+---------+-----------+----------+-------+ PFV      Full                                                 +---------+---------------+---------+-----------+----------+-------+  POP      Full           Yes      Yes                          +---------+---------------+---------+-----------+----------+-------+ PTV      Full                                                 +---------+---------------+---------+-----------+----------+-------+ PERO     Full                                                 +---------+---------------+---------+-----------+----------+-------+    Summary: Right: There is no evidence of deep vein thrombosis in the lower extremity. Left: There is no evidence of deep vein thrombosis in the lower extremity.  *See table(s) above for measurements and observations.    Preliminary     Micro Results    No results found for this or any previous visit (from the past 240 hour(s)).     Today   Subjective    Carol Sanders today has no new complaints ,           Patient has been seen and examined prior to discharge   Objective   Blood pressure (!) 140/96, pulse 79, temperature 97.9 F (36.6 C), temperature source Oral, resp. rate 16, height 5\' 3"  (1.6 m), weight 49.4 kg, SpO2 97 %.  No intake or output data in the 24 hours ending 02/14/18 1746  Exam Gen:- Awake Alert, no acute  distress  HEENT:- [place].AT, No sclera icterus Neck-Supple Neck,No JVD,.  Lungs-  CTAB , good air movement bilaterally  CV- S1, S2 normal, regular Abd-  +ve B.Sounds, Abd Soft, No tenderness,    Extremity/Skin:- No  edema,   good pulses Psych-affect is appropriate, oriented x3 Neuro-no new focal deficits, no tremors   Data Review   CBC w Diff:  Lab Results  Component Value Date   WBC 6.5 02/12/2018   HGB 10.2 (L) 02/12/2018   HCT 30.0 (L) 02/12/2018   PLT 335 02/12/2018   LYMPHOPCT 31 02/12/2018   MONOPCT 9 02/12/2018   EOSPCT 15 02/12/2018   BASOPCT 1 02/12/2018    CMP:  Lab Results  Component Value Date   NA 141 02/12/2018   K 3.1 (L) 02/12/2018   CL 104 02/12/2018   CO2 25 02/12/2018   BUN 12 02/12/2018   CREATININE 0.50 02/12/2018   PROT 6.9 02/12/2018   ALBUMIN 3.7 02/12/2018   BILITOT 0.3 02/12/2018   ALKPHOS 47 02/12/2018   AST 14 (L) 02/12/2018   ALT 11 02/12/2018  .   Total Discharge time is about 33 minutes  Shon Haleourage Norman Bier M.D on 02/14/2018 at 5:46 PM  Pager---[phone]  Go to [email or web address] - password [email or web address] for contact info  [facility] - Office  [phone]

## 2018-02-14 NOTE — Progress Notes (Signed)
Mrs Carol Sanders is concerned she may be allergic to amitriptyline. Pt reports shaking, generalized weakness and " weird" feeling after taking amitriptyline. Medication added to Allergy list

## 2018-02-14 NOTE — Progress Notes (Signed)
VASCULAR LAB PRELIMINARY  PRELIMINARY  PRELIMINARY  PRELIMINARY  Bilateral lower extremity venous duplex  completed.    Preliminary report:  There is no obvious evidence of DVT or SVT noted in the bilateral lower extremities.   Rishith Siddoway, RVT 02/14/2018, 3:54 PM

## 2018-02-14 NOTE — Progress Notes (Signed)
Nsg Discharge Note  Admit Date:  02/12/2018 Discharge date: 02/14/2018   Lazarus SalinesSong Popko to be D/C'd Home per MD order.  AVS completed.  Copy for chart, and copy for patient signed, and dated. Patient/caregiver able to verbalize understanding.  Discharge Medication: Allergies as of 02/14/2018      Reactions   Benadryl [diphenhydramine Hcl (sleep)] Cough      Medication List    STOP taking these medications   ranitidine 150 MG tablet Commonly known as:  ZANTAC     TAKE these medications   acetaminophen 325 MG tablet Commonly known as:  TYLENOL Take 2 tablets (650 mg total) by mouth every 4 (four) hours as needed for mild pain (or temp > 37.5 C (99.5 F)).   amitriptyline 10 MG tablet Commonly known as:  ELAVIL Take 10 mg by mouth at bedtime as needed for sleep.   aspirin 81 MG chewable tablet Chew 1 tablet (81 mg total) by mouth daily with breakfast. Take aspirin 81 and Plavix 75 mg  together for 3 weeks then after 3 weeks , STOP the aspirin but continue to Plavix 75 mg daily indefinitely Notes to patient:  Chew 1 tablet 81mg  total by mouth daily with breakfast. Take aspirin 81 and Plavix 75mg  together for 3 weeks then after 3 weeks STOP taking the aspirin but continue plavix indefinitely.    atorvastatin 40 MG tablet Commonly known as:  LIPITOR Take 1 tablet (40 mg total) by mouth daily at 6 PM. Start taking on:  February 15, 2018   cetirizine 10 MG tablet Commonly known as:  ZYRTEC Take 10 mg by mouth daily.   clopidogrel 75 MG tablet Commonly known as:  PLAVIX Take 1 tablet (75 mg total) by mouth daily. Take aspirin 81 and Plavix 75 mg  together for 3 weeks then after 3 weeks , STOP the aspirin but continue to Plavix 75 mg daily indefinitely Notes to patient:  Chew 1 tablet 81mg  total by mouth daily with breakfast. Take aspirin 81 and Plavix 75mg  together for 3 weeks then after 3 weeks STOP taking the aspirin but continue plavix indefinitely.    ferrous gluconate 324 MG  tablet Commonly known as:  FERGON Take 1 tablet (324 mg total) by mouth daily with breakfast. Start taking on:  February 15, 2018   losartan 50 MG tablet Commonly known as:  COZAAR Take 1 tablet (50 mg total) by mouth daily. What changed:  See the new instructions.   omeprazole 40 MG capsule Commonly known as:  PRILOSEC Take one capsule every morning before breakfast What changed:    how much to take  how to take this  when to take this  additional instructions       Discharge Assessment: Vitals:   02/13/18 2350 02/14/18 0413  BP: (!) 132/95 (!) 140/96  Pulse: 89 79  Resp: 16 16  Temp: 98.3 F (36.8 C) 97.9 F (36.6 C)  SpO2: 95% 97%   Skin clean, dry and intact without evidence of skin break down, no evidence of skin tears noted. IV catheter discontinued intact. Site without signs and symptoms of complications - no redness or edema noted at insertion site, patient denies c/o pain - only slight tenderness at site.  Dressing with slight pressure applied.  D/c Instructions-Education: Discharge instructions given to patient/family with verbalized understanding. D/c education completed with patient/family including follow up instructions, medication list, d/c activities limitations if indicated, with other d/c instructions as indicated by MD - patient able to  verbalize understanding, all questions fully answered. Patient instructed to return to ED, call 911, or call MD for any changes in condition.  Patient escorted via WC, and D/C home via private auto.  Colbert EwingBelinda Aissa Lisowski, RN 02/14/2018 7:28 PM

## 2018-02-14 NOTE — Progress Notes (Signed)
STROKE TEAM PROGRESS NOTE   SUBJECTIVE (INTERVAL HISTORY) No family at bedside, however, I spoke to her brother over patient's cell phone.  Patient has no complaints, neuro symptoms all resolved.  TTE and LE venous Doppler negative.  Patient would like to go home, recommend outpatient TEE or TCD bubble study for cardioembolic work-up.  OBJECTIVE Vitals:   02/13/18 1727 02/13/18 2033 02/13/18 2350 02/14/18 0413  BP: (!) 133/94 (!) 130/93 (!) 132/95 (!) 140/96  Pulse: 75 80 89 79  Resp: 16 16 16 16   Temp: 98 F (36.7 C) 98.6 F (37 C) 98.3 F (36.8 C) 97.9 F (36.6 C)  TempSrc: Oral Oral Oral Oral  SpO2: 98% 95% 95% 97%  Weight:      Height:        CBC:  Recent Labs  Lab 02/12/18 2331 02/12/18 2339  WBC 6.5  --   NEUTROABS 2.9  --   HGB 7.8* 10.2*  HCT 28.2* 30.0*  MCV 61.0*  --   PLT 335  --     Basic Metabolic Panel:  Recent Labs  Lab 02/12/18 2331 02/12/18 2339  NA 141 141  K 3.1* 3.1*  CL 105 104  CO2 25  --   GLUCOSE 108* 108*  BUN 11 12  CREATININE 0.57 0.50  CALCIUM 8.1*  --     Lipid Panel:     Component Value Date/Time   CHOL 176 02/13/2018 0529   TRIG 77 02/13/2018 0529   HDL 49 02/13/2018 0529   CHOLHDL 3.6 02/13/2018 0529   VLDL 15 02/13/2018 0529   LDLCALC 112 (H) 02/13/2018 0529   HgbA1c:  Lab Results  Component Value Date   HGBA1C 5.4 02/13/2018   Urine Drug Screen:     Component Value Date/Time   LABOPIA NONE DETECTED 02/13/2018 0058   COCAINSCRNUR NONE DETECTED 02/13/2018 0058   LABBENZ NONE DETECTED 02/13/2018 0058   AMPHETMU NONE DETECTED 02/13/2018 0058   THCU NONE DETECTED 02/13/2018 0058   LABBARB NONE DETECTED 02/13/2018 0058    Alcohol Level     Component Value Date/Time   ETH <10 02/13/2018 0204    IMAGING   Ct Angio Head W Or Wo Contrast  Result Date: 02/13/2018 CLINICAL DATA:  Follow up stroke. EXAM: CT ANGIOGRAPHY HEAD AND NECK TECHNIQUE: Multidetector CT imaging of the head and neck was performed using  the standard protocol during bolus administration of intravenous contrast. Multiplanar CT image reconstructions and MIPs were obtained to evaluate the vascular anatomy. Carotid stenosis measurements (when applicable) are obtained utilizing NASCET criteria, using the distal internal carotid diameter as the denominator. CONTRAST:  75mL ISOVUE-370 IOPAMIDOL (ISOVUE-370) INJECTION 76% COMPARISON:  CT HEAD February 12, 2018 and MRI/MRA head February 13, 2018. FINDINGS: CTA NECK FINDINGS: AORTIC ARCH: Normal appearance of the thoracic arch, normal branch pattern. The origins of the innominate, left Common carotid artery and subclavian artery are widely patent. RIGHT CAROTID SYSTEM: Common carotid artery is patent. Normal appearance of the carotid bifurcation without hemodynamically significant stenosis by NASCET criteria. Normal appearance of the internal carotid artery. LEFT CAROTID SYSTEM: Common carotid artery is patent. Normal appearance of the carotid bifurcation without hemodynamically significant stenosis by NASCET criteria. Normal appearance of the internal carotid artery. VERTEBRAL ARTERIES:Codominant vertebral arteries. Mild stenosis LEFT vertebral artery origin. Patent vertebral arteries, no dissection. SKELETON: No acute osseous process though bone windows have not been submitted. OTHER NECK: Soft tissues of the neck are nonacute though, not tailored for evaluation. UPPER CHEST: Included lung  apices are clear. No superior mediastinal lymphadenopathy. CTA HEAD FINDINGS: ANTERIOR CIRCULATION: Patent cervical internal carotid arteries, petrous, cavernous and supra clinoid internal carotid arteries. 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 Patent anterior communicating artery. Patent anterior and middle cerebral arteries. No large vessel occlusion, significant stenosis, contrast extravasation . POSTERIOR CIRCULATION: Patent vertebral arteries, vertebrobasilar junction and basilar artery,  as well as main branch vessels. Patent posterior cerebral arteries. Severe stenosis RIGHT P2 segment with indistinct appearance. Robust RIGHT posterior communicating artery present. No large vessel occlusion,  contrast extravasation or aneurysm. VENOUS SINUSES: Major dural venous sinuses are patent though not tailored for evaluation on this angiographic examination. ANATOMIC VARIANTS: None. DELAYED PHASE: No abnormal intracranial enhancement. MIP images reviewed. IMPRESSION: CTA NECK: 1. No hemodynamically significant stenosis ICA's. 2. Mild stenosis LEFT vertebral artery.  Patent vertebral arteries. CTA HEAD: 1. Flow-limiting stenosis distal RIGHT P2 segment, possible thromboembolism. 2. No emergent large vessel occlusion. 3. 2 mm RIGHT paraophthalmic intact aneurysm. Critical Value/emergent results text paged to Dr.ERIC [facility] via AMION secure system on 02/13/2018 at 6:01 am, including interpreting physician's phone number. Electronically Signed   By: Awilda Metro M.D.   On: 02/13/2018 06:02   Dg Chest 2 View  Result Date: 02/13/2018 CLINICAL DATA:  Acute onset of bilateral hand, lip and facial numbness. EXAM: CHEST - 2 VIEW COMPARISON:  Chest radiograph performed 05/15/2016 FINDINGS: The lungs are well-aerated and clear. There is no evidence of focal opacification, pleural effusion or pneumothorax. The heart is normal in size; the mediastinal contour is within normal limits. No acute osseous abnormalities are seen. IMPRESSION: No acute cardiopulmonary process seen. Electronically Signed   By: Roanna Raider M.D.   On: 02/13/2018 06:15   Ct Angio Neck W Or Wo Contrast  Result Date: 02/13/2018 CLINICAL DATA:  Follow up stroke. EXAM: CT ANGIOGRAPHY HEAD AND NECK TECHNIQUE: Multidetector CT imaging of the head and neck was performed using the standard protocol during bolus administration of intravenous contrast. Multiplanar CT image reconstructions and MIPs were obtained to evaluate the vascular  anatomy. Carotid stenosis measurements (when applicable) are obtained utilizing NASCET criteria, using the distal internal carotid diameter as the denominator. CONTRAST:  75mL ISOVUE-370 IOPAMIDOL (ISOVUE-370) INJECTION 76% COMPARISON:  CT HEAD February 12, 2018 and MRI/MRA head February 13, 2018. FINDINGS: CTA NECK FINDINGS: AORTIC ARCH: Normal appearance of the thoracic arch, normal branch pattern. The origins of the innominate, left Common carotid artery and subclavian artery are widely patent. RIGHT CAROTID SYSTEM: Common carotid artery is patent. Normal appearance of the carotid bifurcation without hemodynamically significant stenosis by NASCET criteria. Normal appearance of the internal carotid artery. LEFT CAROTID SYSTEM: Common carotid artery is patent. Normal appearance of the carotid bifurcation without hemodynamically significant stenosis by NASCET criteria. Normal appearance of the internal carotid artery. VERTEBRAL ARTERIES:Codominant vertebral arteries. Mild stenosis LEFT vertebral artery origin. Patent vertebral arteries, no dissection. SKELETON: No acute osseous process though bone windows have not been submitted. OTHER NECK: Soft tissues of the neck are nonacute though, not tailored for evaluation. UPPER CHEST: Included lung apices are clear. No superior mediastinal lymphadenopathy. CTA HEAD FINDINGS: ANTERIOR CIRCULATION: Patent cervical internal carotid arteries, petrous, cavernous and supra clinoid internal carotid arteries. 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 Patent anterior communicating artery. Patent anterior and middle cerebral arteries. No large vessel occlusion, significant stenosis, contrast extravasation . POSTERIOR CIRCULATION: Patent vertebral arteries, vertebrobasilar junction and basilar artery, as well as main branch vessels. Patent posterior cerebral arteries. Severe  stenosis RIGHT P2 segment with indistinct appearance. Robust RIGHT posterior  communicating artery present. No large vessel occlusion,  contrast extravasation or aneurysm. VENOUS SINUSES: Major dural venous sinuses are patent though not tailored for evaluation on this angiographic examination. ANATOMIC VARIANTS: None. DELAYED PHASE: No abnormal intracranial enhancement. MIP images reviewed. IMPRESSION: CTA NECK: 1. No hemodynamically significant stenosis ICA's. 2. Mild stenosis LEFT vertebral artery.  Patent vertebral arteries. CTA HEAD: 1. Flow-limiting stenosis distal RIGHT P2 segment, possible thromboembolism. 2. No emergent large vessel occlusion. 3. 2 mm RIGHT paraophthalmic intact aneurysm. Critical Value/emergent results text paged to Dr.ERIC [facility] via AMION secure system on 02/13/2018 at 6:01 am, including interpreting physician's phone number. Electronically Signed   By: Awilda Metro M.D.   On: 02/13/2018 06:02   Mr Maxine Glenn Head Wo Contrast  Result Date: 02/13/2018 CLINICAL DATA:  LEFT facial numbness radiating to LEFT arm tonight. EXAM: MRI HEAD WITHOUT CONTRAST MRA HEAD WITHOUT CONTRAST TECHNIQUE: Multiplanar, multiecho pulse sequences of the brain and surrounding structures were obtained without intravenous contrast. Angiographic images of the head were obtained using MRA technique without contrast. Axial T2 FLAIR, axial susceptibility weighted imaging, coronal T2 and axial T1 sequences not obtained. Patient did not wish to finish examination. COMPARISON:  CT HEAD February 12, 2018. FINDINGS: MRI HEAD FINDINGS INTRACRANIAL CONTENTS: 12 mm reduced diffusion RIGHT thalamus to cerebral peduncle with low ADC values patchy frontal parietal subcortical white matter T2 hyperintensities. The ventricles and sulci are normal for patient's age. No suspicious parenchymal signal, masses, mass effect. No abnormal extra-axial fluid collections. No extra-axial masses. VASCULAR: Normal major intracranial vascular flow voids present at skull base. SKULL AND UPPER CERVICAL SPINE: No abnormal  sellar expansion. Included pituitary. No suspicious calvarial bone marrow signal. Craniocervical junction maintained. SINUSES/ORBITS: Mild ethmoid sinusitis. The included ocular globes and orbital contents are non-suspicious. OTHER: None. MRA HEAD FINDINGS-moderately motion degraded images through the circle-of-Willis. ANTERIOR CIRCULATION: Normal flow related enhancement of the included cervical, petrous, cavernous and supraclinoid internal carotid arteries. Patent anterior communicating artery. Patent anterior and middle cerebral arteries. POSTERIOR CIRCULATION: Codominant vertebral arteries. Vertebrobasilar arteries are patent, with normal flow related enhancement of the main branch vessels. Patent posterior cerebral arteries. Robust RIGHT posterior communicating artery present. ANATOMIC VARIANTS: None. Source images and MIP images were reviewed. IMPRESSION: MRI HEAD: 1. 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 2. Patchy bifrontal parietal subcortical white matter T2 hyperintensities seen with vasculopathy, Lyme disease, less likely TBI or chronic small vessel ischemic changes. Recommend contrast enhanced images and/or 3 to six-month follow-up to establish stability of findings. MRA HEAD: 1. Motion degraded examination without emergent large vessel occlusion. Limited assessment of circle-of-Willis. 2. To assess for stenosis or vasculitis, CTA HEAD could be performed. Critical Value/emergent results text paged to Dr.ERIC [facility] via AMION secure system on 02/13/2018 at 2:32 am, including interpreting physician's phone number. Electronically Signed   By: Awilda Metro M.D.   On: 02/13/2018 02:32   Mr Brain Wo Contrast  Result Date: 02/13/2018 CLINICAL DATA:  LEFT facial numbness radiating to LEFT arm tonight. EXAM: MRI HEAD WITHOUT CONTRAST MRA HEAD WITHOUT CONTRAST TECHNIQUE: Multiplanar, multiecho pulse sequences of the brain and surrounding  structures were obtained without intravenous contrast. Angiographic images of the head were obtained using MRA technique without contrast. Axial T2 FLAIR, axial susceptibility weighted imaging, coronal T2 and axial T1 sequences not obtained. Patient did not wish to finish examination. COMPARISON:  CT HEAD February 12, 2018. FINDINGS:  MRI HEAD FINDINGS INTRACRANIAL CONTENTS: 12 mm reduced diffusion RIGHT thalamus to cerebral peduncle with low ADC values patchy frontal parietal subcortical white matter T2 hyperintensities. The ventricles and sulci are normal for patient's age. No suspicious parenchymal signal, masses, mass effect. No abnormal extra-axial fluid collections. No extra-axial masses. VASCULAR: Normal major intracranial vascular flow voids present at skull base. SKULL AND UPPER CERVICAL SPINE: No abnormal sellar expansion. Included pituitary. No suspicious calvarial bone marrow signal. Craniocervical junction maintained. SINUSES/ORBITS: Mild ethmoid sinusitis. The included ocular globes and orbital contents are non-suspicious. OTHER: None. MRA HEAD FINDINGS-moderately motion degraded images through the circle-of-Willis. ANTERIOR CIRCULATION: Normal flow related enhancement of the included cervical, petrous, cavernous and supraclinoid internal carotid arteries. Patent anterior communicating artery. Patent anterior and middle cerebral arteries. POSTERIOR CIRCULATION: Codominant vertebral arteries. Vertebrobasilar arteries are patent, with normal flow related enhancement of the main branch vessels. Patent posterior cerebral arteries. Robust RIGHT posterior communicating artery present. ANATOMIC VARIANTS: None. Source images and MIP images were reviewed.  IMPRESSION:   MRI HEAD:  1. 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  2. Patchy bifrontal parietal subcortical white matter T2 hyperintensities seen with vasculopathy, Lyme disease, less likely  TBI or chronic small vessel ischemic changes. Recommend contrast enhanced images and/or 3 to six-month follow-up to establish stability of findings.   MRA HEAD:  1. Motion degraded examination without emergent large vessel occlusion. Limited assessment of circle-of-Willis.  2. To assess for stenosis or vasculitis, CTA HEAD could be performed. Critical Value/emergent results text paged to Dr.ERIC [facility] via AMION secure system on 02/13/2018 at 2:32 am, including interpreting physician's phone number. Electronically Signed   By: Awilda Metro M.D.   On: 02/13/2018 02:32   Ct Head Code Stroke Wo Contrast  Result Date: 02/12/2018 CLINICAL DATA:  Code stroke. LEFT arm and tongue numbness. Last seen normal at 8 a.m. EXAM: CT HEAD WITHOUT CONTRAST TECHNIQUE: Contiguous axial images were obtained from the base of the skull through the vertex without intravenous contrast. COMPARISON:  None. FINDINGS: BRAIN: No intraparenchymal hemorrhage, mass effect nor midline shift. Faint bifrontal subcortical white matter hypodensities. The ventricles and sulci are normal. No acute large vascular territory infarcts. No abnormal extra-axial fluid collections. Basal cisterns are patent. VASCULAR: Unremarkable. SKULL/SOFT TISSUES: No skull fracture. No significant soft tissue swelling. ORBITS/SINUSES: The included ocular globes and orbital contents are normal.Mild lobulated paranasal sinus mucosal thickening. Mastoid air cells are well aerated. OTHER: None. ASPECTS [facility] Stroke Program Early CT Score) - Ganglionic level infarction (caudate, lentiform nuclei, internal capsule, insula, M1-M3 cortex): 7 - Supraganglionic infarction (M4-M6 cortex): 3 Total score (0-10 with 10 being normal): 10 IMPRESSION: 1. No acute intracranial process. 2. ASPECTS is 10. 3. Bifrontal white matter changes seen with old shear injury, chronic small vessel ischemic changes or artifact. Findings would better characterized on MRI of the brain with  and without contrast. 4. Critical Value/emergent results text paged to Dr.Lindzen, Neurology via AMION secure system on 02/12/2018 at 11:34 pm, including interpreting physician's phone number. Electronically Signed   By: Awilda Metro M.D.   On: 02/12/2018 23:34   Vas [place] Lower Extremity Venous (dvt)  Result Date: 02/14/2018  Lower Venous Study Indications: Stroke.  Comparison Study: No prior study on file Performing Technologist: Sherren Kerns RVS  Examination Guidelines: A complete evaluation includes B-mode imaging, spectral Doppler, color Doppler, and power Doppler as needed of all accessible portions of each vessel. Bilateral testing is considered an integral  part of a complete examination. Limited examinations for reoccurring indications may be performed as noted.  Right Venous Findings: +---------+---------------+---------+-----------+----------+-------+          CompressibilityPhasicitySpontaneityPropertiesSummary +---------+---------------+---------+-----------+----------+-------+ CFV      Full           Yes      Yes                          +---------+---------------+---------+-----------+----------+-------+ SFJ      Full                                                 +---------+---------------+---------+-----------+----------+-------+ FV Prox  Full                                                 +---------+---------------+---------+-----------+----------+-------+ FV Mid   Full                                                 +---------+---------------+---------+-----------+----------+-------+ FV DistalFull                                                 +---------+---------------+---------+-----------+----------+-------+ PFV      Full                                                 +---------+---------------+---------+-----------+----------+-------+ POP      Full           Yes      Yes                           +---------+---------------+---------+-----------+----------+-------+ PTV      Full                                                 +---------+---------------+---------+-----------+----------+-------+ PERO     Full                                                 +---------+---------------+---------+-----------+----------+-------+  Left Venous Findings: +---------+---------------+---------+-----------+----------+-------+          CompressibilityPhasicitySpontaneityPropertiesSummary +---------+---------------+---------+-----------+----------+-------+ CFV      Full           Yes      Yes                          +---------+---------------+---------+-----------+----------+-------+ SFJ      Full                                                 +---------+---------------+---------+-----------+----------+-------+  FV Prox  Full                                                 +---------+---------------+---------+-----------+----------+-------+ FV Mid   Full                                                 +---------+---------------+---------+-----------+----------+-------+ FV DistalFull                                                 +---------+---------------+---------+-----------+----------+-------+ PFV      Full                                                 +---------+---------------+---------+-----------+----------+-------+ POP      Full           Yes      Yes                          +---------+---------------+---------+-----------+----------+-------+ PTV      Full                                                 +---------+---------------+---------+-----------+----------+-------+ PERO     Full                                                 +---------+---------------+---------+-----------+----------+-------+    Summary: Right: There is no evidence of deep vein thrombosis in the lower extremity. Left: There is no evidence of deep vein  thrombosis in the lower extremity.  *See table(s) above for measurements and observations.    Preliminary      Transthoracic Echocardiogram  02/14/2018 Study Conclusions - Left ventricle: The cavity size was normal. Systolic function was   normal. The estimated ejection fraction was in the range of 55%   to 60%. Wall motion was normal; there were no regional wall   motion abnormalities. Features are consistent with a pseudonormal   left ventricular filling pattern, with concomitant abnormal   relaxation and increased filling pressure (grade 2 diastolic   dysfunction).    PHYSICAL EXAM  Temp:  [97.9 F (36.6 C)-98.6 F (37 C)] 97.9 F (36.6 C) (12/28 0413) Pulse Rate:  [79-89] 79 (12/28 0413) Resp:  [16] 16 (12/28 0413) BP: (130-140)/(93-96) 140/96 (12/28 0413) SpO2:  [95 %-97 %] 97 % (12/28 0413)  General - Well nourished, well developed, in no apparent distress.  Ophthalmologic - fundi not visualized due to noncooperation.  Cardiovascular - Regular rate and rhythm.  Mental Status -  Level of arousal and orientation to time, place, and person were intact. Language including expression, naming, repetition, comprehension was assessed and found  intact. Fund of Knowledge was assessed and was intact.  Cranial Nerves II - XII - II - Visual field intact OU. III, IV, VI - Extraocular movements intact. V - Facial sensation intact bilaterally. VII - Facial movement intact bilaterally. VIII - Hearing & vestibular intact bilaterally. X - Palate elevates symmetrically. XI - Chin turning & shoulder shrug intact bilaterally. XII - Tongue protrusion intact.  Motor Strength - The patient's strength was normal in all extremities and pronator drift was absent.  Bulk was normal and fasciculations were absent.   Motor Tone - Muscle tone was assessed at the neck and appendages and was normal.  Reflexes - The patient's reflexes were symmetrical in all extremities and she had no  pathological reflexes.  Sensory - Light touch, temperature/pinprick were assessed and were symmetrical.    Coordination - The patient had normal movements in the hands and feet with no ataxia or dysmetria.  Tremor was absent.  Gait and Station - deferred.    ASSESSMENT/PLAN Ms. Carol Sanders is a 42 y.o. female with history of hypertension presenting with left-sided numbness.   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  MRA head -  Motion degraded examination without emergent large vessel occlusion.  CTA H&N - Flow-limiting stenosis distal RIGHT P2 segment, possible thromboembolism.   2D Echo  - EF 55 - 60%. No cardiac source of emboli identified.   LE venous Doppler negative for DVT  Hypercoagulable and autoimmune work-up pending  Recommend outpatient TEE or TCD bubble study for cardioembolic work-up  LDL - 112  HgbA1c - 5.4  UDS - negative  VTE prophylaxis - Lovenox  Diet  - Heart healthy with thin liquids.  No antithrombotic prior to admission, now on aspirin 81 mg daily and Plavix 75.  Continue DAPT for 3 weeks and then aspirin alone.  Patient counseled to be compliant with her antithrombotic medications  Ongoing aggressive stroke risk factor management  Therapy recommendations: None  Disposition: Home  Hypertension  Stable  At home on losartan . Permissive hypertension (OK if < 220/120) but gradually normalize in 5-7 days . Long-term BP goal normotensive  Hyperlipidemia  Lipid lowering medication PTA:  none  LDL 112, goal < 70  Current lipid lowering medication: Lipitor 40 daily  Continue statin at  discharge  Other Stroke Risk Factors  Family hx stroke (mother)  Other Active Problems  2 mm RIGHT paraophthalmic intact aneurysm  Hypokalemia  Hospital day # 1  Neurology will sign off. Please call with questions. Pt will follow up with stroke clinic NP at [facility] in about 4 weeks. Thanks for the consult.  Marvel Plan, MD PhD Stroke Neurology 02/14/2018 8:07 PM    To contact Stroke Continuity provider, please refer to [email or web address]. After hours, contact General Neurology

## 2018-02-15 LAB — CARDIOLIPIN ANTIBODIES, IGG, IGM, IGA
Anticardiolipin IgA: <9 APL U/mL (ref 0–11)
Anticardiolipin IgG: <9 GPL U/mL (ref 0–14)

## 2018-02-15 LAB — LUPUS ANTICOAGULANT PANEL
DRVVT: 31.5 s (ref 0.0–47.0)
PTT LA: 33.9 s (ref 0.0–51.9)

## 2018-02-16 LAB — ANTINUCLEAR ANTIBODIES, IFA: ANA Ab, IFA: NEGATIVE

## 2018-02-16 LAB — HOMOCYSTEINE: Homocysteine: 5.4 umol/L (ref 0.0–15.0)

## 2018-02-17 LAB — BETA-2-GLYCOPROTEIN I ABS, IGG/M/A
Beta-2-Glycoprotein I IgA: 25 GPI IgA units (ref 0–25)
Beta-2-Glycoprotein I IgM: <9 GPI IgM units (ref 0–32)

## 2018-02-19 ENCOUNTER — Other Ambulatory Visit: Payer: Self-pay

## 2018-02-19 NOTE — Patient Outreach (Signed)
[facility]) Care Management  02/19/2018  Carol Sanders [birth year] [id number]  EMMI: stroke red alert Referral date: 02/19/18 Referral reason: feeling worse overall Insurance: blue cross/ blue shield Day # 1 Attempt #1  Telephone call to patient regarding referral. Unable to reach patient. HIPAA compliant voice message left with call back phone number.   PLAN: RNCM will attempt 2nd telephone call to patient within 4 business days.   [name] RN,BSN, CCM [facility] Telephonic  [phone]

## 2018-02-20 ENCOUNTER — Other Ambulatory Visit: Payer: Self-pay

## 2018-02-20 NOTE — Patient Outreach (Signed)
[facility]) Care Management  02/20/2018  Carol Sanders 04/22/1975 [id number] EMMI: stroke red alert Referral date: 02/19/18 Referral reason: feeling worse overall Insurance: blue cross/ blue shield Day # 1 Attempt #2  Telephone call to patient regarding referral. Unable to reach patient. HIPAA compliant voice message left with call back phone number.   PLAN: RNCM will attempt 3rd telephone call to patient within 4 business days.   [name] RN,BSN, CCM [facility] Telephonic  [phone]

## 2018-02-23 ENCOUNTER — Other Ambulatory Visit: Payer: Self-pay

## 2018-02-23 NOTE — Patient Outreach (Signed)
[facility]) Care Management  02/23/2018  Carol Sanders 1975/05/22 [id number]  EMMI:stroke red alert Referral date:02/19/18  1/6/120 Referral reason:feeling worse overall Insurance: blue cross/ blue shield Day #1 and #6  Telephone call to patient regarding EMMI stroke red alert.  HIPAA verified with patient. Patient gave verbal authorization to speak with her daughter, [name] regarding her health information. Daughter states patient is doing ok. She states patient is still have lip and tongue numbness. She states patient saw her primary MD on 02/19/18.  She denies any changes in her treatment plan.  Daughter states patient returned to work on 02/19/18.  Daughter states patient has transportation to her appointments. She state patient is taking her medication as prescribed. Daughter states patient feels a little shaky at times when walking. She reports patient is doing home exercises on treadmill and lifting 5 lbs weights. Daughter states patient has a follow up with the neurologist on 03/16/18.  Daughter denies patient having any further concerns.  RNCM discussed signs/ symptoms of stroke.Advised to call 911 for stroke like symptoms.  RNCM advised patient to notify MD of any changes in condition prior to scheduled appointment.  PLAN; RNCM will close patient due to patient being assessed and having no further needs.   [name] RN,BSN,CCM [facility] Telephonic  [phone]

## 2018-02-24 ENCOUNTER — Ambulatory Visit: Payer: Self-pay

## 2018-03-16 ENCOUNTER — Inpatient Hospital Stay: Payer: BLUE CROSS/BLUE SHIELD | Admitting: Adult Health

## 2020-06-24 IMAGING — CR DG CHEST 2V
2 series · 2 of 2 positions shown · non-contrast
Comparison: Chest radiograph performed 05/15/2016

CLINICAL DATA: Acute onset of bilateral hand, lip and facial
numbness.

EXAM:
CHEST - 2 VIEW

[chest pa]
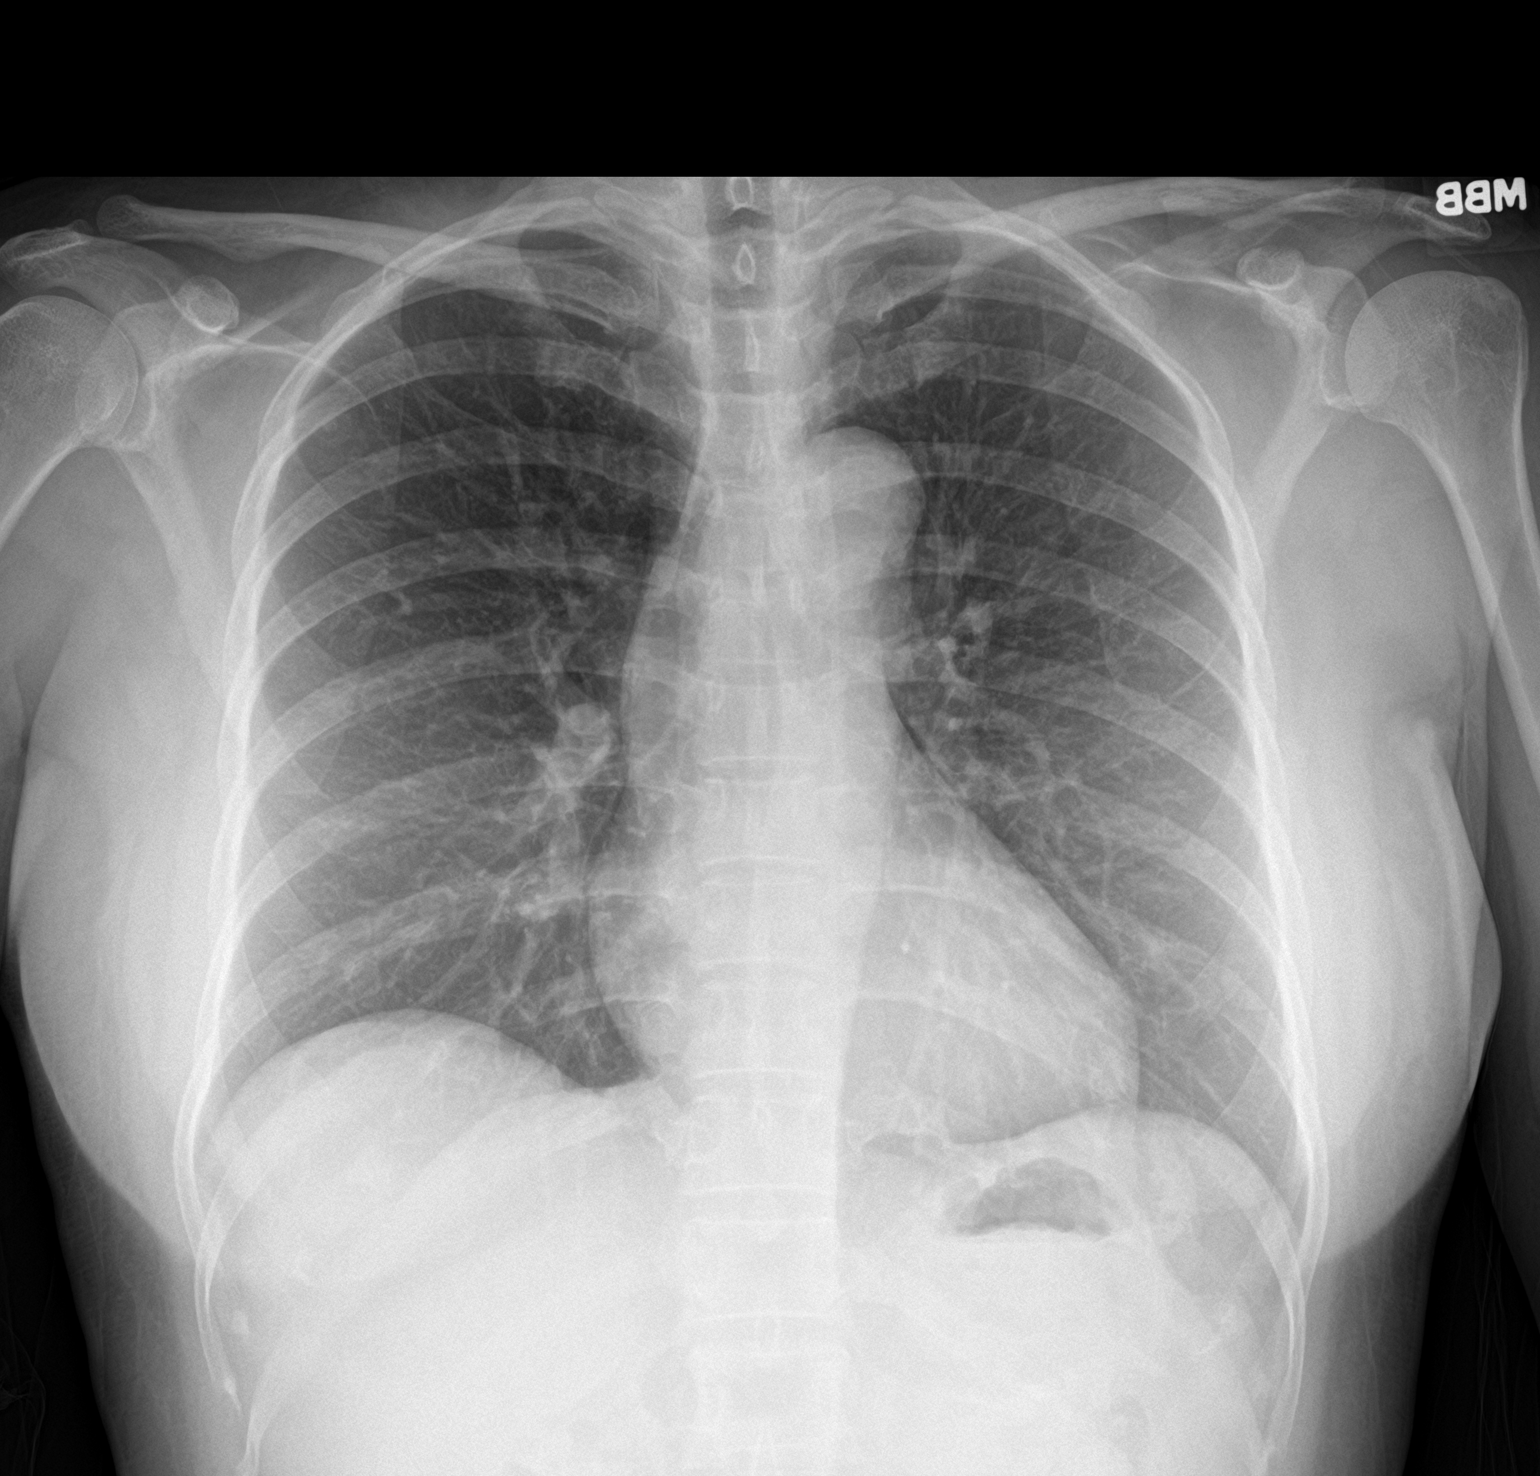

[chest lat]
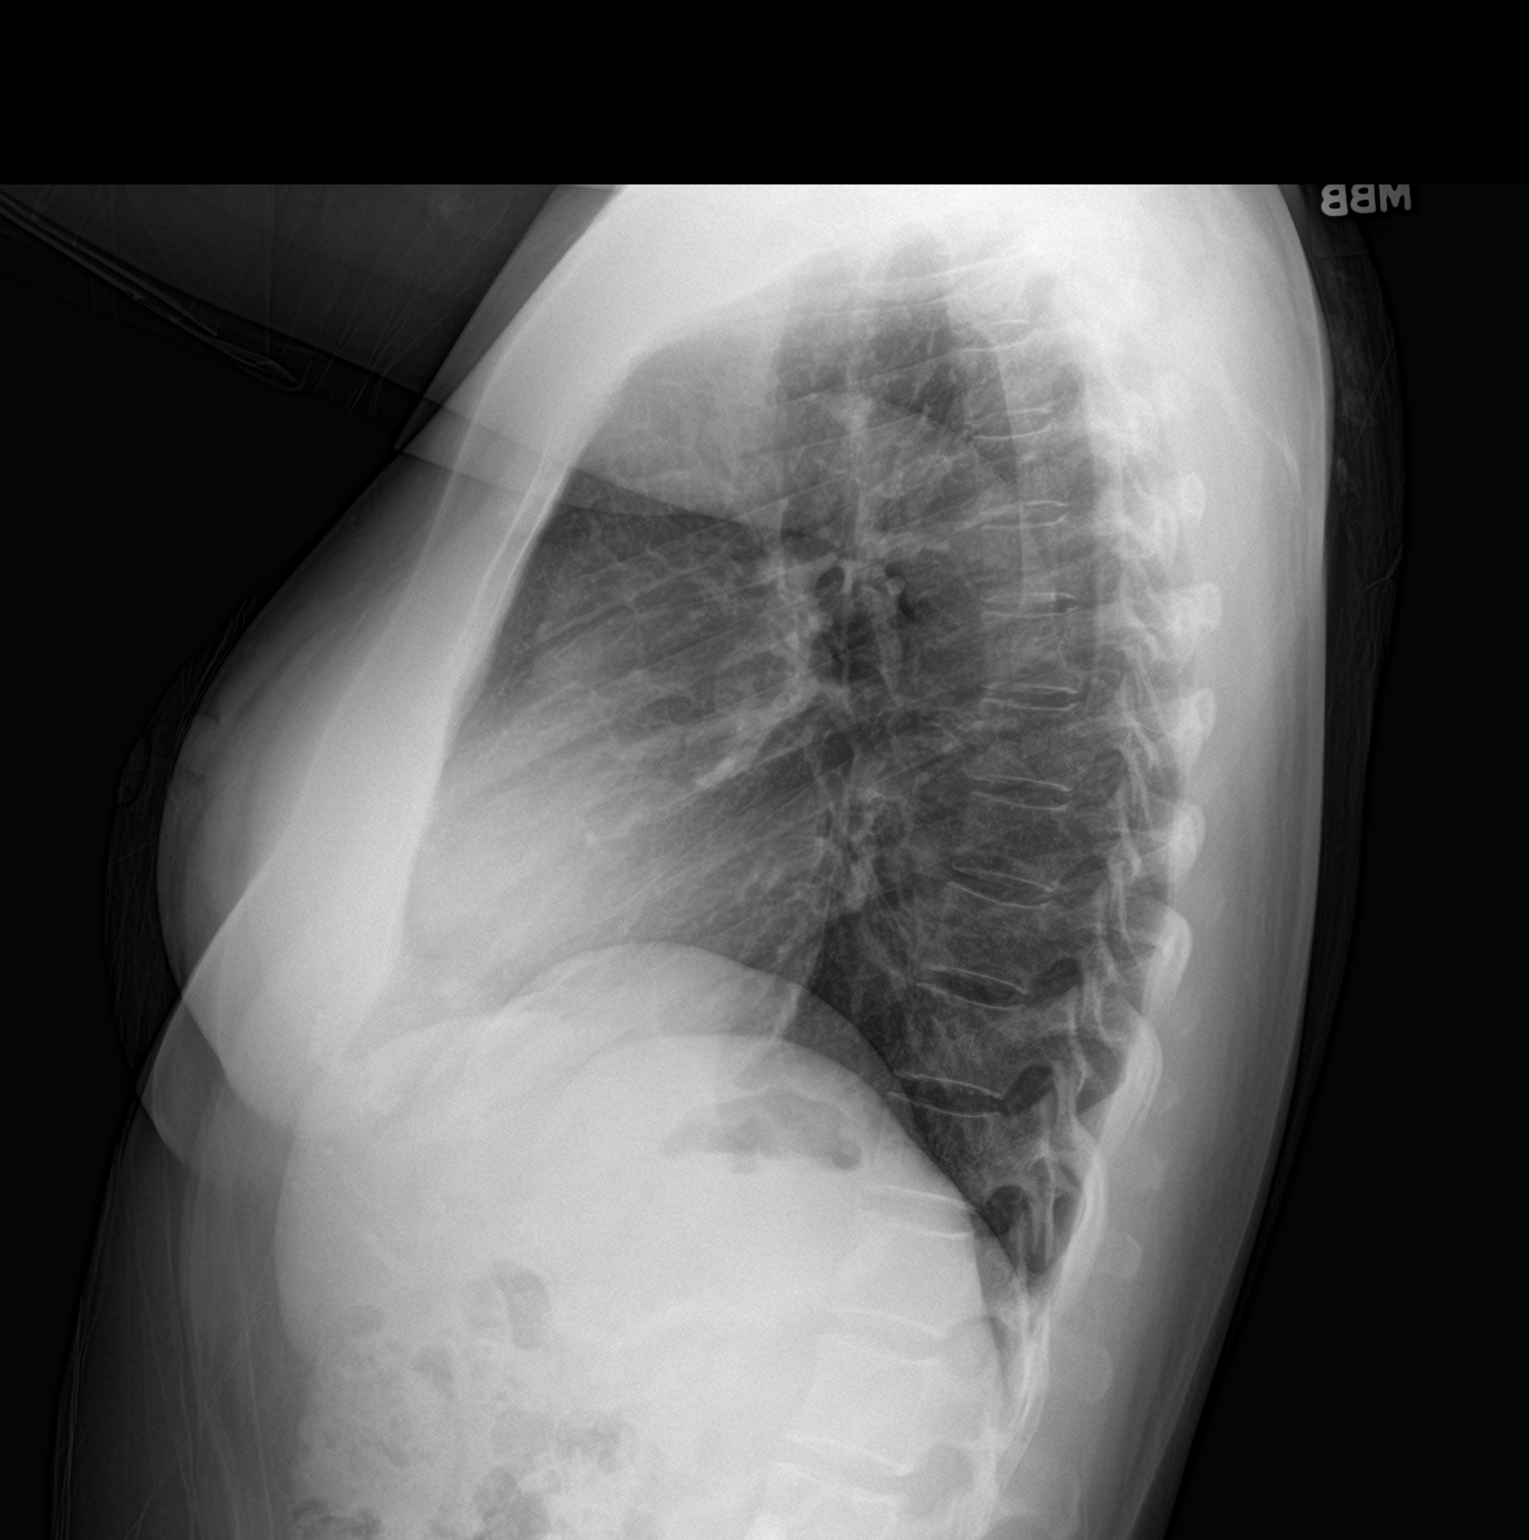

[2 of 2 positions shown; findings below may reference images not displayed]

FINDINGS: The lungs are well-aerated and clear. There is no evidence of focal
opacification, pleural effusion or pneumothorax.

The heart is normal in size; the mediastinal contour is within
normal limits. No acute osseous abnormalities are seen.
IMPRESSION: No acute cardiopulmonary process seen.

## 2022-11-18 LAB — HM HEPATITIS C SCREENING LAB: HM Hepatitis Screen: NEGATIVE

## 2023-05-13 ENCOUNTER — Encounter: Payer: Self-pay | Admitting: Nurse Practitioner

## 2023-05-13 ENCOUNTER — Ambulatory Visit: Payer: 59 | Admitting: Nurse Practitioner

## 2023-05-13 VITALS — BP 136/88 | HR 78 | Temp 97.3°F | Ht 60.0 in | Wt 136.8 lb

## 2023-05-13 DIAGNOSIS — E785 Hyperlipidemia, unspecified: Secondary | ICD-10-CM | POA: Diagnosis not present

## 2023-05-13 DIAGNOSIS — E559 Vitamin D deficiency, unspecified: Secondary | ICD-10-CM | POA: Insufficient documentation

## 2023-05-13 DIAGNOSIS — E611 Iron deficiency: Secondary | ICD-10-CM | POA: Diagnosis not present

## 2023-05-13 DIAGNOSIS — Z8673 Personal history of transient ischemic attack (TIA), and cerebral infarction without residual deficits: Secondary | ICD-10-CM | POA: Insufficient documentation

## 2023-05-13 DIAGNOSIS — J302 Other seasonal allergic rhinitis: Secondary | ICD-10-CM | POA: Diagnosis not present

## 2023-05-13 DIAGNOSIS — I10 Essential (primary) hypertension: Secondary | ICD-10-CM

## 2023-05-13 LAB — LIPID PANEL
Cholesterol: 131 mg/dL (ref 0–200)
HDL: 45.8 mg/dL (ref >39.00)
LDL Cholesterol: 69 mg/dL (ref 0–99)
NonHDL: 85.04
Total CHOL/HDL Ratio: 3
Triglycerides: 80 mg/dL (ref 0.0–149.0)
VLDL: 16 mg/dL (ref 0.0–40.0)

## 2023-05-13 LAB — CBC WITH DIFFERENTIAL/PLATELET
Basophils Absolute: 0.1 10*3/uL (ref 0.0–0.1)
Basophils Relative: 0.7 % (ref 0.0–3.0)
Eosinophils Absolute: 0.8 10*3/uL — ABNORMAL HIGH (ref 0.0–0.7)
Eosinophils Relative: 9.4 % — ABNORMAL HIGH (ref 0.0–5.0)
HCT: 44.3 % (ref 36.0–46.0)
Hemoglobin: 14.5 g/dL (ref 12.0–15.0)
Lymphocytes Relative: 23.5 % (ref 12.0–46.0)
Lymphs Abs: 1.9 10*3/uL (ref 0.7–4.0)
MCHC: 32.7 g/dL (ref 30.0–36.0)
MCV: 91.9 fl (ref 78.0–100.0)
Monocytes Absolute: 0.7 10*3/uL (ref 0.1–1.0)
Monocytes Relative: 8.7 % (ref 3.0–12.0)
Neutro Abs: 4.7 10*3/uL (ref 1.4–7.7)
Neutrophils Relative %: 57.7 % (ref 43.0–77.0)
Platelets: 248 10*3/uL (ref 150.0–400.0)
RBC: 4.82 Mil/uL (ref 3.87–5.11)
RDW: 13.7 % (ref 11.5–15.5)
WBC: 8.1 10*3/uL (ref 4.0–10.5)

## 2023-05-13 LAB — COMPREHENSIVE METABOLIC PANEL
ALT: 18 U/L (ref 0–35)
AST: 15 U/L (ref 0–37)
Albumin: 4.6 g/dL (ref 3.5–5.2)
Alkaline Phosphatase: 67 U/L (ref 39–117)
BUN: 13 mg/dL (ref 6–23)
CO2: 28 meq/L (ref 19–32)
Calcium: 8.9 mg/dL (ref 8.4–10.5)
Chloride: 104 meq/L (ref 96–112)
Creatinine, Ser: 0.53 mg/dL (ref 0.40–1.20)
GFR: 108.54 mL/min (ref >60.00)
Glucose, Bld: 96 mg/dL (ref 70–99)
Potassium: 4.1 meq/L (ref 3.5–5.1)
Sodium: 139 meq/L (ref 135–145)
Total Bilirubin: 0.5 mg/dL (ref 0.2–1.2)
Total Protein: 7.7 g/dL (ref 6.0–8.3)

## 2023-05-13 LAB — VITAMIN D 25 HYDROXY (VIT D DEFICIENCY, FRACTURES): VITD: 24.2 ng/mL — ABNORMAL LOW (ref 30.00–100.00)

## 2023-05-13 MED ORDER — AMLODIPINE BESYLATE 5 MG PO TABS: 5.0000 mg | ORAL_TABLET | Freq: Every day | ORAL | 3 refills | Status: AC

## 2023-05-13 MED ORDER — CETIRIZINE HCL 10 MG PO TABS: 10.0000 mg | ORAL_TABLET | Freq: Every day | ORAL | 3 refills | Status: AC

## 2023-05-13 MED ORDER — CLOPIDOGREL BISULFATE 75 MG PO TABS: 75.0000 mg | ORAL_TABLET | Freq: Every day | ORAL | 3 refills | Status: AC

## 2023-05-13 MED ORDER — MONTELUKAST SODIUM 10 MG PO TABS: 10.0000 mg | ORAL_TABLET | Freq: Every day | ORAL | 3 refills | Status: DC

## 2023-05-13 MED ORDER — ATORVASTATIN CALCIUM 40 MG PO TABS
40.0000 mg | ORAL_TABLET | Freq: Every day | ORAL | 3 refills | Status: DC
Start: 2023-05-13 — End: 2023-05-13

## 2023-05-13 MED ORDER — LOSARTAN POTASSIUM 100 MG PO TABS: 100.0000 mg | ORAL_TABLET | Freq: Every day | ORAL | 3 refills | Status: DC

## 2023-05-13 MED ORDER — ATORVASTATIN CALCIUM 40 MG PO TABS: 40.0000 mg | ORAL_TABLET | Freq: Every day | ORAL | 3 refills | Status: DC

## 2023-05-13 NOTE — Assessment & Plan Note (Signed)
 Chronic, stable. Currently taking an OTC iron supplement daily. Check CBC today, recent ferritin normal.

## 2023-05-13 NOTE — Assessment & Plan Note (Signed)
 Managed with montelukast 10 mg daily and cetirizine 10mg  daily. Refill montelukast prescription.

## 2023-05-13 NOTE — Assessment & Plan Note (Signed)
Check vitamin D levels and treat based on results.

## 2023-05-13 NOTE — Assessment & Plan Note (Signed)
 Chronic, stable. Continue atorvastatin 40mg  daily. Check CMP, CBC, lipid panel today. Refill sent to the pharmacy.

## 2023-05-13 NOTE — Assessment & Plan Note (Signed)
 Chronic, stable. Controlled with losartan 100 mg daily and amlodipine 5mg  daily. Home readings average 120/80 mmHg. Refills sent to the pharmacy. Check CMP, CBC today.

## 2023-05-13 NOTE — Progress Notes (Signed)
 New Patient Visit  BP 136/88 (BP Location: Left Arm, Patient Position: Sitting, Cuff Size: Small)   Pulse 78   Temp (!) 97.3 F (36.3 C)   Ht 5' (1.524 m)   Wt 136 lb 12.8 oz (62.1 kg)   LMP 04/22/2023 (Approximate)   SpO2 96%   BMI 26.72 kg/m    Subjective:    Patient ID: Carol Sanders, female    DOB: [birth year], 48 y.o.   MRN: [id number]  CC: Chief Complaint  Patient presents with   Annual Exam    NP. Est. Care, request lab work, Rx Refills    HPI: Carol Sanders is a 48 y.o. female presents for new patient visit to establish care.  Introduced to Publishing rights manager role and practice setting.  All questions answered.  Discussed provider/patient relationship and expectations.  Discussed the use of AI scribe software for clinical note transcription with the patient, who gave verbal consent to proceed.  History of Present Illness   The patient, with a history of high blood pressure, stroke, and allergies, presents for an initial visit. The patient is currently on multiple medications including Losartan 100mg  for hypertension, Amlodipine 5mg , Montelukast 10mg  for allergies, cetirizine 10mg  daily, plavix 75mg  daily, and atorvastatin 40mg  daily. The patient also takes an over-the-counter iron supplement. The patient denies any current complaints and reports feeling well. The patient denies chest pain, shortness of breath, feeling tired or run down, and stomach pain. The patient's blood pressure is well controlled at home, with readings typically around 120/80. The patient requests refills for all medications.     Depression and Anxiety Screen done:     05/13/2023    9:17 AM  Depression screen PHQ 2/9  Decreased Interest 0  Down, Depressed, Hopeless 0  PHQ - 2 Score 0  Altered sleeping 0  Tired, decreased energy 0  Change in appetite 0  Feeling bad or failure about yourself  0  Trouble concentrating 0  Moving slowly or fidgety/restless 0  Suicidal thoughts 0  PHQ-9 Score 0   Difficult doing work/chores Not difficult at all      05/13/2023    9:17 AM  GAD 7 : Generalized Anxiety Score  Nervous, Anxious, on Edge 0  Control/stop worrying 0  Worry too much - different things 0  Trouble relaxing 0  Restless 0  Easily annoyed or irritable 0  Afraid - awful might happen 0  Total GAD 7 Score 0  Anxiety Difficulty Not difficult at all    Past Medical History:  Diagnosis Date   Allergic rhinitis    Allergy    Hyperlipidemia    Hypertension    Recurrent upper respiratory infection (URI)    Stroke [facility])     Past Surgical History:  Procedure Laterality Date   ADENOIDECTOMY     BREAST ENHANCEMENT SURGERY Bilateral    TONSILLECTOMY      Family History  Problem Relation Age of Onset   CAD Mother        just had CABG recently, in 17s   Stroke Mother        in 43s patient thinks but isnt sure she had stroke   Hyperlipidemia Father    Hypertension Father      Social History   Tobacco Use   Smoking status: Never   Smokeless tobacco: Never  Vaping Use   Vaping status: Never Used  Substance Use Topics   Alcohol use: No   Drug use: No    Current  Outpatient Medications on File Prior to Visit  Medication Sig Dispense Refill   ferrous sulfate 324 MG TBEC Take 324 mg by mouth.     No current facility-administered medications on file prior to visit.     Review of Systems  Constitutional: Negative.   HENT: Negative.    Eyes: Negative.   Respiratory: Negative.    Cardiovascular: Negative.   Gastrointestinal: Negative.   Endocrine: Negative.   Genitourinary: Negative.   Musculoskeletal: Negative.   Skin: Negative.   Neurological: Negative.   Psychiatric/Behavioral: Negative.        Objective:    BP 136/88 (BP Location: Left Arm, Patient Position: Sitting, Cuff Size: Small)   Pulse 78   Temp (!) 97.3 F (36.3 C)   Ht 5' (1.524 m)   Wt 136 lb 12.8 oz (62.1 kg)   LMP 04/22/2023 (Approximate)   SpO2 96%   BMI 26.72 kg/m   Wt  Readings from Last 3 Encounters:  05/13/23 136 lb 12.8 oz (62.1 kg)  02/12/18 109 lb (49.4 kg)  04/17/16 110 lb (49.9 kg)    BP Readings from Last 3 Encounters:  05/13/23 136/88  02/14/18 (!) 140/96  07/10/16 130/82    Physical Exam Vitals and nursing note reviewed.  Constitutional:      General: She is not in acute distress.    Appearance: Normal appearance.  HENT:     Head: Normocephalic and atraumatic.  Eyes:     Conjunctiva/sclera: Conjunctivae normal.  Cardiovascular:     Rate and Rhythm: Normal rate and regular rhythm.     Pulses: Normal pulses.     Heart sounds: Normal heart sounds.  Pulmonary:     Effort: Pulmonary effort is normal.     Breath sounds: Normal breath sounds.  Abdominal:     Palpations: Abdomen is soft.     Tenderness: There is no abdominal tenderness.  Musculoskeletal:        General: Normal range of motion.     Cervical back: Normal range of motion and neck supple.     Right lower leg: No edema.     Left lower leg: No edema.  Lymphadenopathy:     Cervical: No cervical adenopathy.  Skin:    General: Skin is warm and dry.  Neurological:     General: No focal deficit present.     Mental Status: She is alert and oriented to person, place, and time.     Cranial Nerves: No cranial nerve deficit.     Coordination: Coordination normal.     Gait: Gait normal.  Psychiatric:        Mood and Affect: Mood normal.        Behavior: Behavior normal.        Thought Content: Thought content normal.        Judgment: Judgment normal.        Assessment & Plan:   Problem List Items Addressed This Visit       Cardiovascular and Mediastinum   HTN (hypertension) - Primary (Chronic)   Chronic, stable. Controlled with losartan 100 mg daily and amlodipine 5mg  daily. Home readings average 120/80 mmHg. Refills sent to the pharmacy. Check CMP, CBC today.       Relevant Medications   amLODipine (NORVASC) 5 MG tablet   losartan (COZAAR) 100 MG tablet    atorvastatin (LIPITOR) 40 MG tablet   Other Relevant Orders   CBC with Differential/Platelet   Comprehensive metabolic panel     Other  Seasonal allergies   Managed with montelukast 10 mg daily and cetirizine 10mg  daily. Refill montelukast prescription.       Iron deficiency   Chronic, stable. Currently taking an OTC iron supplement daily. Check CBC today, recent ferritin normal.       Vitamin D insufficiency   Check vitamin D levels and treat based on results.       Relevant Orders   VITAMIN D 25 Hydroxy (Vit-D Deficiency, Fractures)   History of CVA (cerebrovascular accident)   History of stroke in 2019. She is currently taking atorvastatin 40mg  daily and plavix 75mg  daily. Check lipid panel today.       Hyperlipidemia   Chronic, stable. Continue atorvastatin 40mg  daily. Check CMP, CBC, lipid panel today. Refill sent to the pharmacy.      Relevant Medications   amLODipine (NORVASC) 5 MG tablet   losartan (COZAAR) 100 MG tablet   atorvastatin (LIPITOR) 40 MG tablet   Other Relevant Orders   CBC with Differential/Platelet   Comprehensive metabolic panel   Lipid panel     Follow up plan: Return in about 6 months (around 11/13/2023) for CPE.  Adonna Horsley A Taiven Greenley

## 2023-05-13 NOTE — Patient Instructions (Signed)
It was great to see you!  We are checking your labs today and will let you know the results via mychart/phone.   I have refilled your medications.   Let's follow-up in 6 months, sooner if you have concerns.  If a referral was placed today, you will be contacted for an appointment. Please note that routine referrals can sometimes take up to 3-4 weeks to process. Please call our office if you haven't heard anything after this time frame.  Take care,  Tamikia Chowning, NP  

## 2023-05-13 NOTE — Assessment & Plan Note (Signed)
 History of stroke in 2019. She is currently taking atorvastatin 40mg  daily and plavix 75mg  daily. Check lipid panel today.

## 2023-05-14 ENCOUNTER — Encounter: Payer: Self-pay | Admitting: Nurse Practitioner

## 2023-05-21 DIAGNOSIS — Z130 Encounter for screening for diseases of the blood and blood-forming organs and certain disorders involving the immune mechanism: Secondary | ICD-10-CM | POA: Diagnosis not present

## 2023-05-21 DIAGNOSIS — Z01419 Encounter for gynecological examination (general) (routine) without abnormal findings: Secondary | ICD-10-CM | POA: Diagnosis not present

## 2023-05-21 DIAGNOSIS — Z6823 Body mass index (BMI) 23.0-23.9, adult: Secondary | ICD-10-CM | POA: Diagnosis not present

## 2023-05-21 DIAGNOSIS — Z1231 Encounter for screening mammogram for malignant neoplasm of breast: Secondary | ICD-10-CM | POA: Diagnosis not present

## 2023-05-21 DIAGNOSIS — Z1389 Encounter for screening for other disorder: Secondary | ICD-10-CM | POA: Diagnosis not present

## 2023-05-21 LAB — HM MAMMOGRAPHY: HM Mammogram: ABNORMAL — AB (ref 0–4)

## 2023-11-13 ENCOUNTER — Ambulatory Visit: Admitting: Family Medicine

## 2023-11-13 ENCOUNTER — Encounter: Payer: Self-pay | Admitting: Family Medicine

## 2023-11-13 VITALS — BP 115/80 | HR 76 | Temp 97.6°F | Resp 18 | Wt 130.8 lb

## 2023-11-13 DIAGNOSIS — R9082 White matter disease, unspecified: Secondary | ICD-10-CM

## 2023-11-13 DIAGNOSIS — Z0000 Encounter for general adult medical examination without abnormal findings: Secondary | ICD-10-CM | POA: Diagnosis not present

## 2023-11-13 DIAGNOSIS — Z1211 Encounter for screening for malignant neoplasm of colon: Secondary | ICD-10-CM

## 2023-11-13 DIAGNOSIS — I10 Essential (primary) hypertension: Secondary | ICD-10-CM | POA: Diagnosis not present

## 2023-11-13 DIAGNOSIS — E782 Mixed hyperlipidemia: Secondary | ICD-10-CM | POA: Diagnosis not present

## 2023-11-13 DIAGNOSIS — E559 Vitamin D deficiency, unspecified: Secondary | ICD-10-CM | POA: Diagnosis not present

## 2023-11-13 DIAGNOSIS — D509 Iron deficiency anemia, unspecified: Secondary | ICD-10-CM | POA: Diagnosis not present

## 2023-11-13 DIAGNOSIS — Z1212 Encounter for screening for malignant neoplasm of rectum: Secondary | ICD-10-CM

## 2023-11-13 DIAGNOSIS — E611 Iron deficiency: Secondary | ICD-10-CM

## 2023-11-13 DIAGNOSIS — Z8673 Personal history of transient ischemic attack (TIA), and cerebral infarction without residual deficits: Secondary | ICD-10-CM | POA: Diagnosis not present

## 2023-11-13 LAB — HEMOGLOBIN A1C: Hgb A1c MFr Bld: 6.1 % (ref 4.6–6.5)

## 2023-11-13 LAB — CBC WITH DIFFERENTIAL/PLATELET
Basophils Absolute: 0 K/uL (ref 0.0–0.1)
Basophils Relative: 0.5 % (ref 0.0–3.0)
Eosinophils Absolute: 0.2 K/uL (ref 0.0–0.7)
Eosinophils Relative: 2.1 % (ref 0.0–5.0)
HCT: 42 % (ref 36.0–46.0)
Hemoglobin: 13.8 g/dL (ref 12.0–15.0)
Lymphocytes Relative: 19.9 % (ref 12.0–46.0)
Lymphs Abs: 1.4 K/uL (ref 0.7–4.0)
MCHC: 33 g/dL (ref 30.0–36.0)
MCV: 89.5 fl (ref 78.0–100.0)
Monocytes Absolute: 0.4 K/uL (ref 0.1–1.0)
Monocytes Relative: 6.1 % (ref 3.0–12.0)
Neutro Abs: 5.1 K/uL (ref 1.4–7.7)
Neutrophils Relative %: 71.4 % (ref 43.0–77.0)
Platelets: 288 K/uL (ref 150.0–400.0)
RBC: 4.69 Mil/uL (ref 3.87–5.11)
RDW: 13.2 % (ref 11.5–15.5)
WBC: 7.1 K/uL (ref 4.0–10.5)

## 2023-11-13 LAB — COMPREHENSIVE METABOLIC PANEL WITH GFR
ALT: 14 U/L (ref 0–35)
AST: 13 U/L (ref 0–37)
Albumin: 4.3 g/dL (ref 3.5–5.2)
Alkaline Phosphatase: 66 U/L (ref 39–117)
BUN: 13 mg/dL (ref 6–23)
CO2: 29 meq/L (ref 19–32)
Calcium: 8.9 mg/dL (ref 8.4–10.5)
Chloride: 103 meq/L (ref 96–112)
Creatinine, Ser: 0.6 mg/dL (ref 0.40–1.20)
GFR: 104.97 mL/min (ref >60.00)
Glucose, Bld: 89 mg/dL (ref 70–99)
Potassium: 3.6 meq/L (ref 3.5–5.1)
Sodium: 140 meq/L (ref 135–145)
Total Bilirubin: 0.5 mg/dL (ref 0.2–1.2)
Total Protein: 7.3 g/dL (ref 6.0–8.3)

## 2023-11-13 LAB — MICROALBUMIN / CREATININE URINE RATIO
Creatinine,U: 18.8 mg/dL
Microalb Creat Ratio: UNDETERMINED mg/g (ref 0.0–30.0)
Microalb, Ur: <0.7 mg/dL

## 2023-11-13 LAB — LIPID PANEL
Cholesterol: 102 mg/dL (ref 0–200)
HDL: 40 mg/dL (ref >39.00)
LDL Cholesterol: 47 mg/dL (ref 0–99)
NonHDL: 61.53
Total CHOL/HDL Ratio: 3
Triglycerides: 72 mg/dL (ref 0.0–149.0)
VLDL: 14.4 mg/dL (ref 0.0–40.0)

## 2023-11-13 LAB — TSH: TSH: 1.27 u[IU]/mL (ref 0.35–5.50)

## 2023-11-13 LAB — B12 AND FOLATE PANEL
Folate: 17 ng/mL (ref >5.9)
Vitamin B-12: 660 pg/mL (ref 211–911)

## 2023-11-13 MED ORDER — FERROUS SULFATE 324 MG PO TBEC: 324.0000 mg | DELAYED_RELEASE_TABLET | Freq: Every day | ORAL | 3 refills | Status: AC

## 2023-11-13 NOTE — Progress Notes (Signed)
 Assessment  Assessment/Plan:  Assessment and Plan Assessment & Plan Adult Wellness Visit Routine adult wellness visit for transfer of care with a focus on blood pressure and cholesterol management. - Request mammogram report - Inquire about previous colonoscopy and follow up as needed  Hypertension Chronic hypertension managed with amlodipine  5 mg daily and losartan  100 mg daily. Blood pressure is well-controlled. Discussed the importance of monitoring to prevent cerebrovascular events. - Continue amlodipine  5 mg daily - Continue losartan  100 mg daily - Check thyroid function - Check renal function with complete metabolic panel, creatinine, electrolytes, and microalbumin creatinine ratio  Hyperlipidemia Chronic hyperlipidemia with a goal LDL of less than 70 mg/dL. - Order fasting lipid panel  Cerebrovascular Accident (Stroke) Stroke in 2019 with no current neurological deficits. Discussed follow-up with a neurologist to prevent recurrence and address anxiety about past events. - Refer to neurologist for follow-up evaluation  Iron  Deficiency Anemia Iron  deficiency anemia managed with ferrous sulfate  324 mg daily. - Order CBC with differential - Order anemia panel including B12, folate, iron , TIBC, and ferritin  Vitamin D  Deficiency Vitamin D  deficiency with previous level of 24 ng/mL, currently on over-the-counter supplementation. - Order vitamin D  level  Allergic Rhinitis Chronic allergic rhinitis managed with cetirizine  10 mg daily and montelukast  10 mg daily. - Continue cetirizine  10 mg daily - Continue montelukast  10 mg daily     Medications Discontinued During This Encounter  Medication Reason   ferrous sulfate  324 MG TBEC Reorder    Patient Counseling(The following topics were reviewed and/or handout was given):  -Nutrition: Stressed importance of moderation in sodium/caffeine intake, saturated fat and cholesterol, caloric balance, sufficient intake of fresh  fruits, vegetables, and fiber.  -Stressed the importance of regular exercise.   -Substance Abuse: Discussed cessation/primary prevention of tobacco, alcohol, or other drug use; driving or other dangerous activities under the influence; availability of treatment for abuse.   -Injury prevention: Discussed safety belts, safety helmets, smoke detector, smoking near bedding or upholstery.   -Sexuality: Discussed sexually transmitted diseases, partner selection, use of condoms, avoidance of unintended pregnancy and contraceptive alternatives.   -Dental health: Discussed importance of regular tooth brushing, flossing, and dental visits.  -Health maintenance and immunizations reviewed. Please refer to Health maintenance section.  Return in about 6 months (around 05/12/2024) for BP, fasting labs, HLD.        Subjective:   Encounter date: 11/13/2023  Chief Complaint  Patient presents with   transfer of care     Pt is fasting today  HM due- vaccinations (patient declined for today) mammogram report request     Discussed the use of AI scribe software for clinical note transcription with the patient, who gave verbal consent to proceed.  History of Present Illness Carol Sanders is a 48 year old female with hypertension, hyperlipidemia, and a history of stroke who presents for transfer of care and annual physical.  Hypertension - Managed with amlodipine  5 mg daily and losartan  100 mg daily - No current symptoms of hypertensive urgency or end-organ damage - Nonadherence to antihypertensive therapy in December 2019 resulted in a cerebrovascular accident  Cerebrovascular accident sequelae - Stroke in December 2019 with left-sided numbness, weakness, and visual disturbances - Imaging (CT and MRI) revealed a small cerebral blood clot - Treated acutely and discharged after several days - Currently on clopidogrel  75 mg daily for secondary prevention - No current focal neurological deficits  described  Hyperlipidemia - Due for fasting lipid panel to assess cholesterol levels -  Goal to maintain cholesterol under 70 mg/dL  Iron  deficiency anemia - Takes ferrous sulfate  324 mg daily - No current symptoms of anemia described  Vitamin d  deficiency - On over-the-counter vitamin D  supplementation - Vitamin D  level was 24 ng/mL six months ago  Allergic rhinitis - Takes cetirizine  10 mg daily and montelukast  10 mg daily for seasonal allergies  Menstrual irregularity - History of irregular menstrual periods, now normalized over the past few years  Anxiety symptoms - Experiences anxiety, especially with overthinking or extensive studying - Manages stress with relaxation techniques and physical activity - Supportive family environment       11/13/2023    9:15 AM 05/13/2023    9:17 AM  Depression screen PHQ 2/9  Decreased Interest 0 0  Down, Depressed, Hopeless 0 0  PHQ - 2 Score 0 0  Altered sleeping  0  Tired, decreased energy  0  Change in appetite  0  Feeling bad or failure about yourself   0  Trouble concentrating  0  Moving slowly or fidgety/restless  0  Suicidal thoughts  0  PHQ-9 Score  0  Difficult doing work/chores  Not difficult at all       05/13/2023    9:17 AM  GAD 7 : Generalized Anxiety Score  Nervous, Anxious, on Edge 0  Control/stop worrying 0  Worry too much - different things 0  Trouble relaxing 0  Restless 0  Easily annoyed or irritable 0  Afraid - awful might happen 0  Total GAD 7 Score 0  Anxiety Difficulty Not difficult at all    Health Maintenance Due  Topic Date Due   Mammogram  Never done   Colonoscopy  Never done   PMH:  The following were reviewed and entered/updated in epic: Past Medical History:  Diagnosis Date   Allergic rhinitis    Allergy    Hyperlipidemia    Hypertension    Recurrent upper respiratory infection (URI)    Stroke [facility])     Patient Active Problem List   Diagnosis Date Noted   Iron  deficiency  05/13/2023   Vitamin D  insufficiency 05/13/2023   History of CVA (cerebrovascular accident) in 2019 05/13/2023   Hyperlipidemia 05/13/2023   Microcytic hypochromic anemia 02/13/2018   HTN (hypertension) 02/13/2018   White matter abnormality on MRI of brain 02/13/2018   Seasonal allergies 03/20/2014    Past Surgical History:  Procedure Laterality Date   ADENOIDECTOMY     BREAST ENHANCEMENT SURGERY Bilateral    TONSILLECTOMY      Family History  Problem Relation Age of Onset   CAD Mother        just had CABG recently, in 33s   Stroke Mother        in 29s patient thinks but isnt sure she had stroke   Hyperlipidemia Father    Hypertension Father     Medications- reviewed and updated Outpatient Medications Prior to Visit  Medication Sig Dispense Refill   amLODipine  (NORVASC ) 5 MG tablet Take 1 tablet (5 mg total) by mouth daily. 90 tablet 3   atorvastatin  (LIPITOR) 40 MG tablet Take 1 tablet (40 mg total) by mouth daily at 6 PM. 90 tablet 3   cetirizine  (ZYRTEC ) 10 MG tablet Take 1 tablet (10 mg total) by mouth daily. 90 tablet 3   clopidogrel  (PLAVIX ) 75 MG tablet Take 1 tablet (75 mg total) by mouth daily. 90 tablet 3   losartan  (COZAAR ) 100 MG tablet Take 1 tablet (100 mg  total) by mouth daily. 90 tablet 3   montelukast  (SINGULAIR ) 10 MG tablet Take 1 tablet (10 mg total) by mouth daily. 90 tablet 3   ferrous sulfate  324 MG TBEC Take 324 mg by mouth.     No facility-administered medications prior to visit.    Allergies  Allergen Reactions   Benadryl [Diphenhydramine Hcl (Sleep)] Cough    Social History   Socioeconomic History   Marital status: Married    Spouse name: Not on file   Number of children: 3   Years of education: Not on file   Highest education level: Not on file  Occupational History   Not on file  Tobacco Use   Smoking status: Never   Smokeless tobacco: Never  Vaping Use   Vaping status: Never Used  Substance and Sexual Activity   Alcohol use:  No   Drug use: No   Sexual activity: Not Currently    Birth control/protection: None  Other Topics Concern   Not on file  Social History Narrative   Not on file   Social Drivers of Health   Financial Resource Strain: Low Risk  (11/12/2023)   Overall Financial Resource Strain (CARDIA)    Difficulty of Paying Living Expenses: Not hard at all  Food Insecurity: No Food Insecurity (11/12/2023)   Hunger Vital Sign    Worried About Running Out of Food in the Last Year: Never true    Ran Out of Food in the Last Year: Never true  Transportation Needs: No Transportation Needs (11/12/2023)   PRAPARE - Administrator, Civil Service (Medical): No    Lack of Transportation (Non-Medical): No  Physical Activity: Sufficiently Active (11/12/2023)   Exercise Vital Sign    Days of Exercise per Week: 6 days    Minutes of Exercise per Session: 50 min  Stress: Stress Concern Present (11/12/2023)   [employer] of Occupational Health - Occupational Stress Questionnaire    Feeling of Stress: Very much  Social Connections: Moderately Integrated (11/12/2023)   Social Connection and Isolation Panel    Frequency of Communication with Friends and Family: More than three times a week    Frequency of Social Gatherings with Friends and Family: Once a week    Attends Religious Services: 1 to 4 times per year    Active Member of [employer] or Organizations: No    Attends Engineer, structural: Not on file    Marital Status: Married           Objective:  Physical Exam: BP 115/80 (BP Location: Left Arm, Patient Position: Sitting, Cuff Size: Large)   Pulse 76   Temp 97.6 F (36.4 C) (Temporal)   Resp 18   Wt 130 lb 12.8 oz (59.3 kg)   LMP 10/27/2023 (Exact Date)   SpO2 97%   BMI 25.55 kg/m   Body mass index is 25.55 kg/m. Wt Readings from Last 3 Encounters:  11/13/23 130 lb 12.8 oz (59.3 kg)  05/13/23 136 lb 12.8 oz (62.1 kg)  02/12/18 109 lb (49.4 kg)    Physical  Exam MEASUREMENTS: Weight- 129-130. GENERAL: Alert, cooperative, well developed, no acute distress. HEENT: Normocephalic, normal oropharynx, moist mucous membranes. CHEST: Clear to auscultation bilaterally, no wheezes, rhonchi, or crackles. CARDIOVASCULAR: Regular rate and rhythm, S1 and S2 normal without murmurs. ABDOMEN: Soft, non-tender, non-distended, without organomegaly, normal bowel sounds. EXTREMITIES: No cyanosis or edema. NEUROLOGICAL: Cranial nerves grossly intact, moves all extremities without gross motor or sensory deficit.  Physical Exam  Prior labs:   Recent Results (from the past 2160 hours)  TSH     Status: None   Collection Time: 11/13/23 10:25 AM  Result Value Ref Range   TSH 1.27 0.35 - 5.50 uIU/mL  Lipid panel     Status: None   Collection Time: 11/13/23 10:25 AM  Result Value Ref Range   Cholesterol 102 0 - 200 mg/dL    Comment: ATP III Classification       Desirable:  < 200 mg/dL               Borderline High:  200 - 239 mg/dL          High:  > = 759 mg/dL   Triglycerides 27.9 0.0 - 149.0 mg/dL    Comment: Normal:  <849 mg/dLBorderline High:  150 - 199 mg/dL   HDL 59.99 >60.99 mg/dL   VLDL 85.5 0.0 - 59.9 mg/dL   LDL Cholesterol 47 0 - 99 mg/dL   Total CHOL/HDL Ratio 3     Comment:                Men          Women1/2 Average Risk     3.4          3.3Average Risk          5.0          4.42X Average Risk          9.6          7.13X Average Risk          15.0          11.0                       NonHDL 61.53     Comment: NOTE:  Non-HDL goal should be 30 mg/dL higher than patient's LDL goal (i.e. LDL goal of < 70 mg/dL, would have non-HDL goal of < 100 mg/dL)  Hemoglobin J8r     Status: None   Collection Time: 11/13/23 10:25 AM  Result Value Ref Range   Hgb A1c MFr Bld 6.1 4.6 - 6.5 %    Comment: Glycemic Control Guidelines for People with Diabetes:Non Diabetic:  <6%Goal of Therapy: <7%Additional Action Suggested:  >8%   Microalbumin / creatinine urine  ratio     Status: None   Collection Time: 11/13/23 10:25 AM  Result Value Ref Range   Microalb, Ur <0.7 mg/dL   Creatinine,U 81.1 mg/dL   Microalb Creat Ratio Unable to calculate 0.0 - 30.0 mg/g    Comment: Unable to Calculate due to Microalbumin Result of <0.7 mg/dL  CBC with Differential/Platelet     Status: None   Collection Time: 11/13/23 10:25 AM  Result Value Ref Range   WBC 7.1 4.0 - 10.5 K/uL   RBC 4.69 3.87 - 5.11 Mil/uL   Hemoglobin 13.8 12.0 - 15.0 g/dL   HCT 57.9 63.9 - 53.9 %   MCV 89.5 78.0 - 100.0 fl   MCHC 33.0 30.0 - 36.0 g/dL   RDW 86.7 88.4 - 84.4 %   Platelets 288.0 150.0 - 400.0 K/uL   Neutrophils Relative % 71.4 43.0 - 77.0 %   Lymphocytes Relative 19.9 12.0 - 46.0 %   Monocytes Relative 6.1 3.0 - 12.0 %   Eosinophils Relative 2.1 0.0 - 5.0 %   Basophils Relative 0.5 0.0 - 3.0 %   Neutro Abs 5.1 1.4 - 7.7 K/uL  Lymphs Abs 1.4 0.7 - 4.0 K/uL   Monocytes Absolute 0.4 0.1 - 1.0 K/uL   Eosinophils Absolute 0.2 0.0 - 0.7 K/uL   Basophils Absolute 0.0 0.0 - 0.1 K/uL  Comprehensive metabolic panel with GFR     Status: None   Collection Time: 11/13/23 10:25 AM  Result Value Ref Range   Sodium 140 135 - 145 mEq/L   Potassium 3.6 3.5 - 5.1 mEq/L   Chloride 103 96 - 112 mEq/L   CO2 29 19 - 32 mEq/L   Glucose, Bld 89 70 - 99 mg/dL   BUN 13 6 - 23 mg/dL   Creatinine, Ser 9.39 0.40 - 1.20 mg/dL   Total Bilirubin 0.5 0.2 - 1.2 mg/dL   Alkaline Phosphatase 66 39 - 117 U/L   AST 13 0 - 37 U/L   ALT 14 0 - 35 U/L   Total Protein 7.3 6.0 - 8.3 g/dL   Albumin 4.3 3.5 - 5.2 g/dL   GFR 895.02 >39.99 mL/min    Comment: Calculated using the CKD-EPI Creatinine Equation (2021)   Calcium  8.9 8.4 - 10.5 mg/dL  A87 and Folate Panel     Status: None   Collection Time: 11/13/23 10:25 AM  Result Value Ref Range   Vitamin B-12 660 211 - 911 pg/mL   Folate 17.0 >5.9 ng/mL    Lab Results  Component Value Date   CHOL 102 11/13/2023   CHOL 131 05/13/2023   CHOL 176  02/13/2018   Lab Results  Component Value Date   HDL 40.00 11/13/2023   HDL 45.80 05/13/2023   HDL 49 02/13/2018   Lab Results  Component Value Date   LDLCALC 47 11/13/2023   LDLCALC 69 05/13/2023   LDLCALC 112 (H) 02/13/2018   Lab Results  Component Value Date   TRIG 72.0 11/13/2023   TRIG 80.0 05/13/2023   TRIG 77 02/13/2018   Lab Results  Component Value Date   CHOLHDL 3 11/13/2023   CHOLHDL 3 05/13/2023   CHOLHDL 3.6 02/13/2018   No results found for: LDLDIRECT  Last metabolic panel Lab Results  Component Value Date   GLUCOSE 89 11/13/2023   NA 140 11/13/2023   K 3.6 11/13/2023   CL 103 11/13/2023   CO2 29 11/13/2023   BUN 13 11/13/2023   CREATININE 0.60 11/13/2023   GFR 104.97 11/13/2023   CALCIUM  8.9 11/13/2023   PROT 7.3 11/13/2023   ALBUMIN 4.3 11/13/2023   BILITOT 0.5 11/13/2023   ALKPHOS 66 11/13/2023   AST 13 11/13/2023   ALT 14 11/13/2023   ANIONGAP 11 02/12/2018    Lab Results  Component Value Date   HGBA1C 6.1 11/13/2023    Last CBC Lab Results  Component Value Date   WBC 7.1 11/13/2023   HGB 13.8 11/13/2023   HCT 42.0 11/13/2023   MCV 89.5 11/13/2023   MCH 16.9 (L) 02/12/2018   RDW 13.2 11/13/2023   PLT 288.0 11/13/2023    Lab Results  Component Value Date   TSH 1.27 11/13/2023    No results found for: PSA1, PSA  Last vitamin D  Lab Results  Component Value Date   VD25OH 24.20 (L) 05/13/2023    Lab Results  Component Value Date   BILIRUBINUR NEGATIVE 02/13/2018   PROTEINUR NEGATIVE 02/13/2018   LEUKOCYTESUR NEGATIVE 02/13/2018    Lab Results  Component Value Date   MICROALBUR <0.7 11/13/2023     At today's visit, we discussed treatment options, associated risk and benefits, and engage  in counseling as needed.  Additionally the following were reviewed: Past medical records, past medical and surgical history, family and social background, as well as relevant laboratory results, imaging findings, and  specialty notes, where applicable.  This message was generated using dictation software, and as a result, it may contain unintentional typos or errors.  Nevertheless, extensive effort was made to accurately convey at the pertinent aspects of the patient visit.    There may have been are other unrelated non-urgent complaints, but due to the busy schedule and the amount of time already spent with her, time does not permit to address these issues at today's visit. Another appointment may have or has been requested to review these additional issues.     Arvella Hummer, MD, MS

## 2023-11-18 LAB — NO CULTURE INDICATED

## 2023-11-18 LAB — URINALYSIS W MICROSCOPIC + REFLEX CULTURE
Bacteria, UA: NONE SEEN /HPF
Bilirubin Urine: NEGATIVE
Glucose, UA: NEGATIVE
Hgb urine dipstick: NEGATIVE
Hyaline Cast: NONE SEEN /LPF
Ketones, ur: NEGATIVE
Leukocyte Esterase: NEGATIVE
Nitrites, Initial: NEGATIVE
Protein, ur: NEGATIVE
RBC / HPF: NONE SEEN /HPF (ref 0–2)
Specific Gravity, Urine: 1.003 (ref 1.001–1.035)
Squamous Epithelial / HPF: NONE SEEN /HPF (ref <=5)
WBC, UA: NONE SEEN /HPF (ref 0–5)
pH: 6.5 (ref 5.0–8.0)

## 2023-11-18 LAB — IRON,TIBC AND FERRITIN PANEL
%SAT: 21 % (ref 16–45)
Ferritin: 23 ng/mL (ref 16–232)
Iron: 73 ug/dL (ref 45–160)
TIBC: 341 ug/dL (ref 250–450)

## 2023-11-18 LAB — VITAMIN D 1,25 DIHYDROXY
Vitamin D 1, 25 (OH)2 Total: 49 pg/mL (ref 18–72)
Vitamin D2 1, 25 (OH)2: <8 pg/mL
Vitamin D3 1, 25 (OH)2: 49 pg/mL

## 2023-11-19 ENCOUNTER — Ambulatory Visit: Payer: Self-pay | Admitting: Family Medicine

## 2023-11-24 ENCOUNTER — Encounter: Payer: Self-pay | Admitting: Physician Assistant

## 2023-12-01 ENCOUNTER — Telehealth: Payer: Self-pay | Admitting: Family Medicine

## 2023-12-01 NOTE — Telephone Encounter (Signed)
 Copied from CRM [id number]. Topic: Referral - Question >> Dec 01, 2023  4:28 PM Viola FALCON wrote: Patient needs Neurology referral changed to [facility] [address], [address] [place],  [place]  [address].   Patient would like message via MyChart to let her know when referral is in so she can schedule

## 2024-01-12 ENCOUNTER — Encounter: Payer: Self-pay | Admitting: Physician Assistant

## 2024-01-12 ENCOUNTER — Ambulatory Visit: Admitting: Physician Assistant

## 2024-01-12 ENCOUNTER — Telehealth: Payer: Self-pay

## 2024-01-12 VITALS — BP 110/68 | HR 90 | Ht 61.0 in | Wt 132.0 lb

## 2024-01-12 DIAGNOSIS — Z1211 Encounter for screening for malignant neoplasm of colon: Secondary | ICD-10-CM

## 2024-01-12 DIAGNOSIS — E611 Iron deficiency: Secondary | ICD-10-CM

## 2024-01-12 DIAGNOSIS — K219 Gastro-esophageal reflux disease without esophagitis: Secondary | ICD-10-CM | POA: Diagnosis not present

## 2024-01-12 DIAGNOSIS — R1310 Dysphagia, unspecified: Secondary | ICD-10-CM | POA: Diagnosis not present

## 2024-01-12 DIAGNOSIS — Z8673 Personal history of transient ischemic attack (TIA), and cerebral infarction without residual deficits: Secondary | ICD-10-CM

## 2024-01-12 MED ORDER — PANTOPRAZOLE SODIUM 20 MG PO TBEC: 20.0000 mg | DELAYED_RELEASE_TABLET | Freq: Two times a day (BID) | ORAL | 0 refills | Status: DC

## 2024-01-12 MED ORDER — FDGARD 25-20.75 MG PO CAPS: ORAL_CAPSULE | ORAL | Status: AC

## 2024-01-12 NOTE — Patient Instructions (Addendum)
 You have been scheduled for an endoscopy. Please follow written instructions given to you at your visit today.  If you use inhalers (even only as needed), please bring them with you on the day of your procedure.  If you take any of the following medications, they will need to be adjusted prior to your procedure:   DO NOT TAKE 7 DAYS PRIOR TO TEST- Trulicity (dulaglutide) Ozempic, Wegovy (semaglutide) Mounjaro, Zepbound (tirzepatide) Bydureon Bcise (exanatide extended release)  DO NOT TAKE 1 DAY PRIOR TO YOUR TEST Rybelsus (semaglutide) Adlyxin (lixisenatide) Victoza (liraglutide) Byetta (exanatide) ___________________________________________________________________________  We have given you samples of the following medication to take: FDgard, take 2 capsules as needed.   Thank you for trusting me with your gastrointestinal care!  Alan Coombs, PA-C  __________________________________________________________________________   Silent reflux: Not all heartburn burns...SABRASABRASABRA  Add on pantoprazole  20 mg BID Can do FDGard samples  What is LPR? Laryngopharyngeal reflux (LPR) or silent reflux is a condition in which acid that is made in the stomach travels up the esophagus (swallowing tube) and gets to the throat. Not everyone with reflux has a lot of heartburn or indigestion. In fact, many people with LPR never have heartburn. This is why LPR is called SILENT REFLUX, and the terms Silent reflux and LPR are often used interchangeably. Because LPR is silent, it is sometimes difficult to diagnose.  How can you tell if you have LPR?  Chronic hoarseness- Some people have hoarseness that comes and goes throat clearing  Cough It can cause shortness of breath and cause asthma like symptoms. a feeling of a lump in the throat  difficulty swallowing a problem with too much nose and throat drainage.  Some people will feel their esophagus spasm which feels like their heart beating hard  and fast, this will usually be after a meal, at rest, or lying down at night.    How do I treat this? Treatment for LPR should be individualized, and your doctor will suggest the best treatment for you. Generally there are several treatments for LPR: changing habits and diet to reduce reflux,  medications to reduce stomach acid, and  surgery to prevent reflux. Most people with LPR need to modify how and when they eat, as well as take some medication, to get well. Sometimes, nonprescription liquid antacids, such as Maalox, Gelucil and Mylanta are recommended. When used, these antacids should be taken four times each day - one tablespoon one hour after each meal and before bedtime. Dietary and lifestyle changes alone are not often enough to control LPR - medications that reduce stomach acid are also usually needed. These must be prescribed by our doctor.   TIPS FOR REDUCING REFLUX AND LPR Control your LIFE-STYLE and your DIET! If you use tobacco, QUIT.  Smoking makes you reflux. After every cigarette you have some LPR.  Don't wear clothing that is too tight, especially around the waist (trousers, corsets, belts).  Do not lie down just after eating...in fact, do not eat within three hours of bedtime.  You should be on a low-fat diet.  Limit your intake of red meat.  Limit your intake of butter.  Avoid fried foods.  Avoid chocolate  Avoid cheese.  Avoid eggs. Specifically avoid caffeine (especially coffee and tea), soda pop (especially cola) and mints.  Avoid alcoholic beverages, particularly in the evening.  Abdominal bloating and discomfort may be due to intestinal sensitivity or symptoms of irritable bowel syndrome. To relieve symptoms, avoid:  Broccoli  Baked beans  Cabbage  Carbonated drinks  Cauliflower  Chewing gum  Hard candy Abdominal distention resulting from weak abdominal muscles:  Is better in the morning  Gets worse as the day progresses  Is relieved by lying  down Flatulence is gas created through bacterial action in the bowel and passed rectally. Keep in mind that:  10-18 passages per day are normal  Primary gases are harmless and odorless  Noticeable smells are trace gases related to food intake Foods to AVOID that are likely to form gas include:  Milk, dairy products, and medications that contain lactose--If your body doesn't produce the enzyme (lactase) to break it down.  Certain vegetables--baked beans, cauliflower, broccoli, cabbage  Certain starches--wheat, oats, corn, potatoes. Rice is a good substitute. Identify offending foods. Reduce or eliminate these gas-forming foods from your diet. Can look at the FODMAP diet.     FODMAP stands for fermentable oligo-, di-, mono-saccharides and polyols (1). These are the scientific terms used to classify groups of carbs that are difficult for our body to digest and that are notorious for triggering digestive symptoms like bloating, gas, loose stools and stomach pain.   You can try low FODMAP diet  - start with eliminating just one column at a time that you feel may be a trigger for you. - the table at the very bottom contains foods that are low in FODMAPs   Sometimes trying to eliminate the FODMAP's from your diet is difficult or tricky, if you are stuggling with trying to do the elimination diet you can try an enzyme.  There is a food enzymes that you sprinkle in or on your food that helps break down the FODMAP. You can read more about the enzyme by going to this site: https://fodzyme.com/  Small intestinal bacterial overgrowth (SIBO) occurs when there is an abnormal increase in the overall bacterial population in the small intestine -- particularly types of bacteria not commonly found in that part of the digestive tract. Small intestinal bacterial overgrowth (SIBO) commonly results when a circumstance -- such as surgery or disease -- slows the passage of food and waste products in the digestive  tract, creating a breeding ground for bacteria.  Signs and symptoms of SIBO often include: Loss of appetite Abdominal pain Nausea Bloating An uncomfortable feeling of fullness after eating Diarrhea or constipation, depending on the type of gas produced  What foods trigger SIBO? While foods aren't the original cause of SIBO, certain foods do encourage the overgrowth of the wrong bacteria in your small intestine. If you're feeding them their favorite foods, they're going to grow more, and that will trigger more of your SIBO symptoms. By the same token, you can help reduce the overgrowth by starving the problematic bacteria of their favorite foods. This strategy has led to a number of proposed SIBO eating plans. The plans vary, and so do individual results. But in general, they tend to recommend limiting carbohydrates.  These include: Sugars and sweeteners. Fruits and starchy vegetables. Dairy products. Grains.  There is a test for this we can do called a breath test, if you are positive we will treat you with an antibiotic to see if it helps.  Your symptoms are very suspicious for this condition, as discussed, we will start you on an antibiotic to see if this helps.    _______________________________________________________  If your blood pressure at your visit was 140/90 or greater, please contact your primary care physician to follow up on this.  _______________________________________________________  If you  are age 98 or older, your body mass index should be between 23-30. Your Body mass index is 24.94 kg/m. If this is out of the aforementioned range listed, please consider follow up with your Primary Care Provider.  If you are age 14 or younger, your body mass index should be between 19-25. Your Body mass index is 24.94 kg/m. If this is out of the aformentioned range listed, please consider follow up with your Primary Care Provider.    ________________________________________________________  The [facility] providers would like to encourage you to use MYCHART to communicate with providers for non-urgent requests or questions.  Due to long hold times on the telephone, sending your provider a message by [facility] may be a faster and more efficient way to get a response.  Please allow 48 business hours for a response.  Please remember that this is for non-urgent requests.  _______________________________________________________  [facility] is using a team-based approach to care.  Your team is made up of your doctor and two to three APPS. Our APPS (Nurse Practitioners and Physician Assistants) work with your physician to ensure care continuity for you. They are fully qualified to address your health concerns and develop a treatment plan. They communicate directly with your gastroenterologist to care for you. Seeing the Advanced Practice Practitioners on your physician's team can help you by facilitating care more promptly, often allowing for earlier appointments, access to diagnostic testing, procedures, and other specialty referrals.

## 2024-01-12 NOTE — Progress Notes (Signed)
 01/12/2024 Rhenda Miyamoto [id number] Aug 02, 1975  Referring provider: Sebastian Beverley NOVAK, MD Primary GI doctor: Dr. San  ASSESSMENT AND PLAN:  GERD, burping/beltching, has a lot of mucus, some dysphagia, dyspepsia, bloating 03/2018 EGD normal upper GI tract negative celiac Saw ENT and was given pepcid No NSAIDS, no ETOH, no tobacco use No significant weight loss, melena -Schedule EGD with dilatation to evaluate for stenosis, tumor, erosive/infectious esophagititis, and EOE, H pylori, gastritis. I discussed risks of EGD with patient today, including risk of sedation, bleeding or perforation.  Patient provides understanding and gave verbal consent to proceed. -In the interim patient advised about swallowing precautions.  -Eat slowly, chew food well before swallowing.  -Drink liquids in between each bite to avoid food impaction. - ER precautions discussed with the patient -alginate therapy given -Lifestyle changes discussed, avoid NSAIDS, ETOH, hand out given to the patient  Ischemic CVA 2019 echocardiogram EF 55 to 60% grade 2 diastolic dysfunction unremarkable valves On Plavix , will get permission from Dr. Sebastian Hold Plavix  for 5 days before procedure will instruct when and how to resume after procedure.  Patient understands that there is a low but real risk of cardiovascular event such as heart attack, stroke, or embolism /  thrombosis, or ischemia while off Plavix .  The patient consents to proceed.  Will communicate by phone or EMR with patient's prescribing provider to confirm that holding Plavix  is reasonable in this case.   IDA 05/2022 iron  98, ferritin 23 Continues to have menses No family history of colonoscopy 03/2018 EGD/colonoscopy normal upper GI tract negative celiac, good bowel prep normal TI colonic mucosa and internal hemorrhoids. Likely from menses, continue iron  as needed  CCS 03/2018 colonoscopy  good bowel prep normal TI colonic mucosa and internal  hemorrhoids. 03/2028 recall unless abnormal symptoms  Patient Care Team: Sebastian Beverley NOVAK, MD as PCP - General (Family Medicine)  HISTORY OF PRESENT ILLNESS: 48 y.o. female with a past medical history listed below presents for evaluation of GERD and discuss screening colonoscopy.   Previously seen by DEVONNA Nakai at [facility] 08/06/2022 for external hemorrhoids.   Discussed the use of AI scribe software for clinical note transcription with the patient, who gave verbal consent to proceed.  History of Present Illness   Lenetta Piche is a 48 year old female with anemia who presents with gastrointestinal symptoms including heartburn and bloating.  She experiences heartburn, bloating, and excessive gas, particularly after eating quickly or delaying meals. The discomfort is described as a 'tornado' sensation occurring at night. She takes medication for heartburn, which provides relief within five minutes. No dysphagia, but food sometimes feels slow to pass.  She has a history of anemia and underwent an endoscopy in February 2020, which was negative for celiac disease. She has not been tested for H. pylori. Her weight fluctuates, with a recent loss of five pounds, but no significant weight loss overall. Bowel movements are regular, occurring twice daily, with no melena, diarrhea, or constipation.  She has a history of internal hemorrhoids identified during a colonoscopy in February 2020, which was otherwise normal. No rectal bleeding or hematochezia currently. There is no family history of colon cancer, although her uncle recently passed away from liver cancer.  She experiences heavy menstrual periods with clots and has been told she has low iron  levels. She denies any alcohol or tobacco use and does not take NSAIDs like Aleve or ibuprofen. She has allergies that cause nasal congestion and occasional epistaxis.  She  reports that she has never smoked. She has never used smokeless tobacco. She  reports that she does not drink alcohol and does not use drugs.  RELEVANT GI HISTORY, IMAGING AND LABS: Results   DIAGNOSTIC Colonoscopy: Internal hemorrhoids (03/2018) Endoscopy: Negative for celiac disease (2022)     Wt Readings from Last 3 Encounters:  01/12/24 132 lb (59.9 kg)  11/13/23 130 lb 12.8 oz (59.3 kg)  05/13/23 136 lb 12.8 oz (62.1 kg)    CBC    Component Value Date/Time   WBC 7.1 11/13/2023 1025   RBC 4.69 11/13/2023 1025   HGB 13.8 11/13/2023 1025   HCT 42.0 11/13/2023 1025   PLT 288.0 11/13/2023 1025   MCV 89.5 11/13/2023 1025   MCV 74.3 (A) 04/25/2014 1948   MCH 16.9 (L) 02/12/2018 2331   MCHC 33.0 11/13/2023 1025   RDW 13.2 11/13/2023 1025   LYMPHSABS 1.4 11/13/2023 1025   MONOABS 0.4 11/13/2023 1025   EOSABS 0.2 11/13/2023 1025   BASOSABS 0.0 11/13/2023 1025   Recent Labs    05/13/23 0943 11/13/23 1025  HGB 14.5 13.8    CMP     Component Value Date/Time   NA 140 11/13/2023 1025   K 3.6 11/13/2023 1025   CL 103 11/13/2023 1025   CO2 29 11/13/2023 1025   GLUCOSE 89 11/13/2023 1025   BUN 13 11/13/2023 1025   CREATININE 0.60 11/13/2023 1025   CALCIUM  8.9 11/13/2023 1025   PROT 7.3 11/13/2023 1025   ALBUMIN 4.3 11/13/2023 1025   AST 13 11/13/2023 1025   ALT 14 11/13/2023 1025   ALKPHOS 66 11/13/2023 1025   BILITOT 0.5 11/13/2023 1025   GFRNONAA >60 02/12/2018 2331   GFRAA >60 02/12/2018 2331      Latest Ref Rng & Units 11/13/2023   10:25 AM 05/13/2023    9:43 AM 02/12/2018   11:31 PM  Hepatic Function  Total Protein 6.0 - 8.3 g/dL 7.3  7.7  6.9   Albumin 3.5 - 5.2 g/dL 4.3  4.6  3.7   AST 0 - 37 U/L 13  15  14    ALT 0 - 35 U/L 14  18  11    Alk Phosphatase 39 - 117 U/L 66  67  47   Total Bilirubin 0.2 - 1.2 mg/dL 0.5  0.5  0.3       Current Medications:    Current Outpatient Medications (Cardiovascular):    amLODipine  (NORVASC ) 5 MG tablet, Take 1 tablet (5 mg total) by mouth daily.   atorvastatin  (LIPITOR) 40 MG tablet, Take  1 tablet (40 mg total) by mouth daily at 6 PM.   losartan  (COZAAR ) 100 MG tablet, Take 1 tablet (100 mg total) by mouth daily.  Current Outpatient Medications (Respiratory):    cetirizine  (ZYRTEC ) 10 MG tablet, Take 1 tablet (10 mg total) by mouth daily.   montelukast  (SINGULAIR ) 10 MG tablet, Take 1 tablet (10 mg total) by mouth daily.   Current Outpatient Medications (Hematological):    clopidogrel  (PLAVIX ) 75 MG tablet, Take 1 tablet (75 mg total) by mouth daily.   ferrous sulfate  324 MG TBEC, Take 1 tablet (324 mg total) by mouth daily with breakfast. (Patient not taking: Reported on 01/12/2024)  Current Outpatient Medications (Other):    pantoprazole  (PROTONIX ) 20 MG tablet, Take 1 tablet (20 mg total) by mouth 2 (two) times daily before a meal.   Medical History:  Past Medical History:  Diagnosis Date   Allergic rhinitis  Allergy    Hyperlipidemia    Hypertension    Recurrent upper respiratory infection (URI)    Stroke (HCC)    Allergies:  Allergies  Allergen Reactions   Benadryl [Diphenhydramine Hcl (Sleep)] Cough     Surgical History:  She  has a past surgical history that includes Tonsillectomy; Adenoidectomy; and Breast enhancement surgery (Bilateral). Family History:  Her family history includes CAD in her mother; Hyperlipidemia in her father; Hypertension in her father; Stroke in her mother.  REVIEW OF SYSTEMS  : All other systems reviewed and negative except where noted in the History of Present Illness.  PHYSICAL EXAM: BP 110/68   Pulse 90   Ht 5' 1 (1.549 m)   Wt 132 lb (59.9 kg)   LMP 01/10/2024   SpO2 97%   BMI 24.94 kg/m  Physical Exam   GENERAL APPEARANCE: Well nourished, in no apparent distress. HEENT: No cervical lymphadenopathy, unremarkable thyroid, sclerae anicteric, conjunctiva pink. RESPIRATORY: Respiratory effort normal, breath sounds equal bilaterally without rales, rhonchi, or wheezing. CARDIO: Regular rate and rhythm with no  murmurs, rubs, or gallops, peripheral pulses intact. ABDOMEN: Soft, non-distended, active bowel sounds in all four quadrants, mild abdominal discomfort on palpation, no significant tenderness, no rebound, no mass appreciated. RECTAL: Declines. MUSCULOSKELETAL: Full range of motion, normal gait, without edema. SKIN: Dry, intact without rashes or lesions. No jaundice. NEURO: Alert, oriented, no focal deficits. PSYCH: Cooperative, normal mood and affect.      Alan JONELLE Coombs, PA-C 2:40 PM

## 2024-01-12 NOTE — Telephone Encounter (Signed)
   [facility] [address] [place], [place]  [address] Phone:  [phone]   Fax:  [phone]      Carol Sanders 1975-12-26 [id number]  01/12/24   Dear Dr. Beverley Hummer:  We have scheduled the above named patient for an Endoscopy procedure. Our records show that she is on anticoagulation therapy.  Please advise as to whether the patient may come off their therapy of Plavix  5 days prior to their procedure which is scheduled for 01/22/24.  Please route your response to [facility], CMA or fax response to [phone].  Sincerely,    [facility]

## 2024-01-13 ENCOUNTER — Encounter: Payer: Self-pay | Admitting: Gastroenterology

## 2024-01-20 NOTE — Telephone Encounter (Signed)
 I spoke to Curtistine and explained that the patient has been holding her Plavix  and we never got a reply to our fax request.  He asked that I refax the request and he will have the doctor to sign it.  Letter refaxed to [phone]

## 2024-01-20 NOTE — Telephone Encounter (Signed)
 I spoke to Carol Sanders and advised her that we never heard back from Dr. Sebastian about her Plavix  hold so we need to reschedule.  She stated that she had been holding the medication since 11/27 and she had spoken to the PCP office about it.  I told her that I will call the office to confirm and I will let Dr. San know.

## 2024-01-21 NOTE — Telephone Encounter (Signed)
 I spoke to Angie this morning and she advised that she will have Dr. Sebastian complete the clearance note.  I advised her that he can send the note via Epic if that is easier.

## 2024-01-21 NOTE — Telephone Encounter (Signed)
 Verbal order given from Dr. Sebastian for pt to stop Plavix  for procedure on 01/22/24.

## 2024-01-22 ENCOUNTER — Encounter: Payer: Self-pay | Admitting: Gastroenterology

## 2024-01-22 ENCOUNTER — Ambulatory Visit: Admitting: Gastroenterology

## 2024-01-22 VITALS — BP 122/81 | HR 79 | Temp 98.2°F | Resp 13 | Ht 61.0 in | Wt 132.0 lb

## 2024-01-22 DIAGNOSIS — K2970 Gastritis, unspecified, without bleeding: Secondary | ICD-10-CM

## 2024-01-22 DIAGNOSIS — R1013 Epigastric pain: Secondary | ICD-10-CM | POA: Diagnosis not present

## 2024-01-22 DIAGNOSIS — K219 Gastro-esophageal reflux disease without esophagitis: Secondary | ICD-10-CM

## 2024-01-22 DIAGNOSIS — Q399 Congenital malformation of esophagus, unspecified: Secondary | ICD-10-CM | POA: Diagnosis not present

## 2024-01-22 DIAGNOSIS — K319 Disease of stomach and duodenum, unspecified: Secondary | ICD-10-CM | POA: Diagnosis not present

## 2024-01-22 DIAGNOSIS — R1310 Dysphagia, unspecified: Secondary | ICD-10-CM

## 2024-01-22 DIAGNOSIS — K3189 Other diseases of stomach and duodenum: Secondary | ICD-10-CM | POA: Diagnosis not present

## 2024-01-22 DIAGNOSIS — R140 Abdominal distension (gaseous): Secondary | ICD-10-CM | POA: Diagnosis not present

## 2024-01-22 DIAGNOSIS — E785 Hyperlipidemia, unspecified: Secondary | ICD-10-CM | POA: Diagnosis not present

## 2024-01-22 DIAGNOSIS — I10 Essential (primary) hypertension: Secondary | ICD-10-CM | POA: Diagnosis not present

## 2024-01-22 DIAGNOSIS — R1319 Other dysphagia: Secondary | ICD-10-CM

## 2024-01-22 DIAGNOSIS — K2950 Unspecified chronic gastritis without bleeding: Secondary | ICD-10-CM | POA: Diagnosis not present

## 2024-01-22 MED ORDER — PANTOPRAZOLE SODIUM 40 MG PO TBEC: DELAYED_RELEASE_TABLET | ORAL | 3 refills | Status: AC

## 2024-01-22 MED ORDER — SODIUM CHLORIDE 0.9 % IV SOLN: 500.0000 mL | Freq: Once | INTRAVENOUS | Status: AC

## 2024-01-22 NOTE — Progress Notes (Signed)
 Called to room to assist during endoscopic procedure.  Patient ID and intended procedure confirmed with present staff. Received instructions for my participation in the procedure from the performing physician.

## 2024-01-22 NOTE — Progress Notes (Signed)
 Sedate, gd SR, tolerated procedure well, VSS, report to RN

## 2024-01-22 NOTE — Op Note (Signed)
 [facility] Patient Name: Carol Sanders Procedure Date: 01/22/2024 9:57 AM MRN: [id number] Endoscopist: Sandor Flatter , MD, [id number] Age: 48 Referring MD:  Date of Birth: 1975/10/28 Gender: Female Account #: [id number] Procedure:                Upper GI endoscopy Indications:              Dysphagia, Suspected esophageal reflux, Abdominal                            bloating, Dyspepsia, Eructation Medicines:                Monitored Anesthesia Care Procedure:                Pre-Anesthesia Assessment:                           - Prior to the procedure, a History and Physical                            was performed, and patient medications and                            allergies were reviewed. The patient's tolerance of                            previous anesthesia was also reviewed. The risks                            and benefits of the procedure and the sedation                            options and risks were discussed with the patient.                            All questions were answered, and informed consent                            was obtained. Prior Anticoagulants: The patient has                            taken Plavix  (clopidogrel ), last dose was 5 days                            prior to procedure. ASA Grade Assessment: III - A                            patient with severe systemic disease. After                            reviewing the risks and benefits, the patient was                            deemed in satisfactory condition to undergo the  procedure.                           After obtaining informed consent, the endoscope was                            passed under direct vision. Throughout the                            procedure, the patient's blood pressure, pulse, and                            oxygen saturations were monitored continuously. The                            Olympus Scope [id number] was introduced through  the                            mouth, and advanced to the second part of duodenum.                            The upper GI endoscopy was accomplished without                            difficulty. The patient tolerated the procedure                            well. Scope In: Scope Out: Findings:                 A single area of ectopic gastric mucosa was found                            in the upper third of the esophagus.                           The remainder of the esophagus was normal. Given                            the symptoms of intermittent dysphagia, the                            decision was made to perform empiric esophageal                            dilation. The scope was withdrawn. Dilation was                            performed with a Maloney dilator with no resistance                            at 54 Fr. The dilation site was examined following                            endoscope reinsertion and  showed no bleeding,                            mucosal tear or perforation. Estimated blood loss:                            none.                           The Z-line was regular and was found 35 cm from the                            incisors.                           Localized mild inflammation characterized by                            erosions and erythema was found in the gastric                            antrum. Biopsies were taken with a cold forceps for                            Helicobacter pylori testing. Estimated blood loss                            was minimal.                           The gastric fundus and gastric body were normal.                            Biopsies were taken with a cold forceps for                            Helicobacter pylori testing. Estimated blood loss                            was minimal.                           The examined duodenum was normal. Biopsies were                            taken with a cold forceps for  histology. Estimated                            blood loss was minimal. Complications:            No immediate complications. Estimated Blood Loss:     Estimated blood loss was minimal. Impression:               - Ectopic gastric mucosa in the upper third of the  esophagus.                           - Normal esophagus. Dilated.                           - Z-line regular, 35 cm from the incisors.                           - Gastritis. Biopsied.                           - Normal gastric fundus and gastric body. Biopsied.                           - Normal examined duodenum. Biopsied. Recommendation:           - Patient has a contact number available for                            emergencies. The signs and symptoms of potential                            delayed complications were discussed with the                            patient. Return to normal activities tomorrow.                            Written discharge instructions were provided to the                            patient.                           - Resume previous diet.                           - Resume Plavix  (clopidogrel ) at prior dose                            tomorrow.                           - Await pathology results.                           - Use Protonix  (pantoprazole ) 40 mg PO BID for 4                            weeks to promote mucosal healing of gastritis and                            treatment of suspected reflux. Upper GI symptoms                            well-controlled after 4 weeks, can reduce to  Protonix  40 mg daily and titrate to the lowest                            effective dose.                           - Return to GI clinic in 3 months or sooner as                            needed.SABRA Sandor Flatter, MD 01/22/2024 10:34:59 AM

## 2024-01-22 NOTE — Progress Notes (Signed)
 GASTROENTEROLOGY PROCEDURE H&P NOTE   Primary Care Physician: Sebastian Beverley NOVAK, MD    Reason for Procedure:  GERD, belching, dysphagia, dyspepsia, bloating  Plan:    EGD  Patient is appropriate for endoscopic procedure(s) in the ambulatory ([facility]) setting.  The nature of the procedure, as well as the risks, benefits, and alternatives were carefully and thoroughly reviewed with the patient. Ample time for discussion and questions allowed. The patient understood, was satisfied, and agreed to proceed. I personally addressed all patient questions and concerns.     HPI: Carol Sanders is a 48 y.o. female who presents for EGD for evaluation of reflux symptoms, belching, bloating, dyspepsia, intermittent dysphagia.  Patient was most recently seen in the Gastroenterology Clinic on 01/12/2024.  No interval change in medical history since that appointment. Please refer to that note for full details regarding GI history and clinical presentation.   Holding Plavix  for procedure today.  Past Medical History:  Diagnosis Date   Allergic rhinitis    Allergy    Hyperlipidemia    Hypertension    Recurrent upper respiratory infection (URI)    Stroke [facility] [place])     Past Surgical History:  Procedure Laterality Date   ADENOIDECTOMY     BREAST ENHANCEMENT SURGERY Bilateral    TONSILLECTOMY      Prior to Admission medications   Medication Sig Start Date End Date Taking? Authorizing Provider  amLODipine  (NORVASC ) 5 MG tablet Take 1 tablet (5 mg total) by mouth daily. 05/13/23  Yes McElwee, Lauren A, NP  atorvastatin  (LIPITOR) 40 MG tablet Take 1 tablet (40 mg total) by mouth daily at 6 PM. 05/13/23  Yes McElwee, Lauren A, NP  cetirizine  (ZYRTEC ) 10 MG tablet Take 1 tablet (10 mg total) by mouth daily. 05/13/23  Yes McElwee, Lauren A, NP  clopidogrel  (PLAVIX ) 75 MG tablet Take 1 tablet (75 mg total) by mouth daily. 05/13/23  Yes McElwee, Lauren A, NP  ferrous sulfate  324 MG TBEC Take 1 tablet (324 mg  total) by mouth daily with breakfast. 11/13/23 11/07/24 Yes Sebastian Beverley NOVAK, MD  losartan  (COZAAR ) 100 MG tablet Take 1 tablet (100 mg total) by mouth daily. 05/13/23  Yes McElwee, Lauren A, NP  montelukast  (SINGULAIR ) 10 MG tablet Take 1 tablet (10 mg total) by mouth daily. 05/13/23  Yes McElwee, Lauren A, NP  Caraway Oil-Levomenthol (FDGARD) 25-20.75 MG CAPS Take 2 capsules as needed. 01/12/24   Craig Alan SAUNDERS, PA-C  pantoprazole  (PROTONIX ) 20 MG tablet Take 1 tablet (20 mg total) by mouth 2 (two) times daily before a meal. 01/12/24 03/12/24  Craig Alan SAUNDERS, PA-C    Current Outpatient Medications  Medication Sig Dispense Refill   amLODipine  (NORVASC ) 5 MG tablet Take 1 tablet (5 mg total) by mouth daily. 90 tablet 3   atorvastatin  (LIPITOR) 40 MG tablet Take 1 tablet (40 mg total) by mouth daily at 6 PM. 90 tablet 3   cetirizine  (ZYRTEC ) 10 MG tablet Take 1 tablet (10 mg total) by mouth daily. 90 tablet 3   clopidogrel  (PLAVIX ) 75 MG tablet Take 1 tablet (75 mg total) by mouth daily. 90 tablet 3   ferrous sulfate  324 MG TBEC Take 1 tablet (324 mg total) by mouth daily with breakfast. 90 tablet 3   losartan  (COZAAR ) 100 MG tablet Take 1 tablet (100 mg total) by mouth daily. 90 tablet 3   montelukast  (SINGULAIR ) 10 MG tablet Take 1 tablet (10 mg total) by mouth daily. 90 tablet 3   Caraway  Oil-Levomenthol (FDGARD) 25-20.75 MG CAPS Take 2 capsules as needed.     pantoprazole  (PROTONIX ) 20 MG tablet Take 1 tablet (20 mg total) by mouth 2 (two) times daily before a meal. 60 tablet 0   Current Facility-Administered Medications  Medication Dose Route Frequency Provider Last Rate Last Admin   0.9 %  sodium chloride infusion  500 mL Intravenous Once Aliha Diedrich V, DO        Allergies as of 01/22/2024 - Review Complete 01/22/2024  Allergen Reaction Noted   Benadryl [diphenhydramine hcl (sleep)] Cough 03/20/2014    Family History  Problem Relation Age of Onset   CAD Mother        just  had CABG recently, in 46s   Stroke Mother        in 51s patient thinks but isnt sure she had stroke   Hyperlipidemia Father    Hypertension Father    Colon cancer Neg Hx    Esophageal cancer Neg Hx    Rectal cancer Neg Hx    Stomach cancer Neg Hx     Social History   Socioeconomic History   Marital status: Married    Spouse name: Not on file   Number of children: 3   Years of education: Not on file   Highest education level: Not on file  Occupational History   Not on file  Tobacco Use   Smoking status: Never   Smokeless tobacco: Never  Vaping Use   Vaping status: Never Used  Substance and Sexual Activity   Alcohol use: No   Drug use: No   Sexual activity: Not Currently    Birth control/protection: None  Other Topics Concern   Not on file  Social History Narrative   Not on file   Social Drivers of Health   Financial Resource Strain: Low Risk  (11/12/2023)   Overall Financial Resource Strain (CARDIA)    Difficulty of Paying Living Expenses: Not hard at all  Food Insecurity: No Food Insecurity (11/12/2023)   Hunger Vital Sign    Worried About Running Out of Food in the Last Year: Never true    Ran Out of Food in the Last Year: Never true  Transportation Needs: No Transportation Needs (11/12/2023)   PRAPARE - Administrator, Civil Service (Medical): No    Lack of Transportation (Non-Medical): No  Physical Activity: Sufficiently Active (11/12/2023)   Exercise Vital Sign    Days of Exercise per Week: 6 days    Minutes of Exercise per Session: 50 min  Stress: Stress Concern Present (11/12/2023)   [employer] of Occupational Health - Occupational Stress Questionnaire    Feeling of Stress: Very much  Social Connections: Moderately Integrated (11/12/2023)   Social Connection and Isolation Panel    Frequency of Communication with Friends and Family: More than three times a week    Frequency of Social Gatherings with Friends and Family: Once a week     Attends Religious Services: 1 to 4 times per year    Active Member of [employer] or Organizations: No    Attends Engineer, Structural: Not on file    Marital Status: Married  Catering Manager Violence: Not on file    Physical Exam: Vital signs in last 24 hours: @BP  121/76   Pulse 74   Temp 98.2 F (36.8 C)   Ht 5' 1 (1.549 m)   Wt 132 lb (59.9 kg)   LMP 01/10/2024   SpO2 96%   BMI  24.94 kg/m  GEN: NAD EYE: Sclerae anicteric ENT: MMM CV: Non-tachycardic Pulm: CTA b/l GI: Soft, NT/ND NEURO:  Alert & Oriented x 3   Sandor Flatter, DO [facility]   01/22/2024 10:06 AM

## 2024-01-22 NOTE — Patient Instructions (Addendum)
 Resume Plavix  (clopidogrel ) at prior dose tomorrow. Await pathology results. Use Protonix  (pantoprazole ) 40 mg PO twice daily for 4 weeks to promote mucosal healing of gastritis and  treatment of suspected reflux. Upper GI symptoms well-controlled after 4 weeks, can reduce to Protonix  40 mg daily and titrate to the lowest effective dose. Return to GI clinic in 3 months or sooner as needed.  We will send you a letter to schedule this.   YOU HAD AN ENDOSCOPIC PROCEDURE TODAY AT THE [facility]:   Refer to the procedure report that was given to you for any specific questions about what was found during the examination.  If the procedure report does not answer your questions, please call your gastroenterologist to clarify.  If you requested that your care partner not be given the details of your procedure findings, then the procedure report has been included in a sealed envelope for you to review at your convenience later.  YOU SHOULD EXPECT: Some feelings of bloating in the abdomen. Passage of more gas than usual.  Walking can help get rid of the air that was put into your GI tract during the procedure and reduce the bloating.  Please Note:  You might notice some irritation and congestion in your nose or some drainage.  This is from the oxygen used during your procedure.  There is no need for concern and it should clear up in a day or so.  SYMPTOMS TO REPORT IMMEDIATELY:  Following upper endoscopy (EGD)  Vomiting of blood or coffee ground material  New chest pain or pain under the shoulder blades  Painful or persistently difficult swallowing  New shortness of breath  Fever of 100F or higher  Black, tarry-looking stools  For urgent or emergent issues, a gastroenterologist can be reached at any hour by calling [phone]. Do not use MyChart messaging for urgent concerns.    DIET:  We do recommend a small meal at first, but then you may proceed to your regular diet.  Drink plenty  of fluids but you should avoid alcoholic beverages for 24 hours.  ACTIVITY:  You should plan to take it easy for the rest of today and you should NOT DRIVE or use heavy machinery until tomorrow (because of the sedation medicines used during the test).    FOLLOW UP: Our staff will call the number listed on your records the next business day following your procedure.  We will call around 7:15- 8:00 am to check on you and address any questions or concerns that you may have regarding the information given to you following your procedure. If we do not reach you, we will leave a message.      SIGNATURES/CONFIDENTIALITY: You and/or your care partner have signed paperwork which will be entered into your electronic medical record.  These signatures attest to the fact that that the information above on your After Visit Summary has been reviewed and is understood.  Full responsibility of the confidentiality of this discharge information lies with you and/or your care-partner.

## 2024-01-22 NOTE — Progress Notes (Signed)
 Pt's states no medical or surgical changes since previsit or office visit.

## 2024-01-22 NOTE — Progress Notes (Signed)
 No dilation diet per Dr. San

## 2024-01-23 ENCOUNTER — Telehealth: Payer: Self-pay

## 2024-01-23 NOTE — Telephone Encounter (Signed)
 Post procedure follow up call, no answer

## 2024-01-27 LAB — SURGICAL PATHOLOGY

## 2024-01-30 ENCOUNTER — Ambulatory Visit: Payer: Self-pay | Admitting: Gastroenterology

## 2024-02-02 ENCOUNTER — Other Ambulatory Visit: Payer: Self-pay | Admitting: Gastroenterology

## 2024-02-02 DIAGNOSIS — K2970 Gastritis, unspecified, without bleeding: Secondary | ICD-10-CM

## 2024-02-02 DIAGNOSIS — R1319 Other dysphagia: Secondary | ICD-10-CM

## 2024-02-02 DIAGNOSIS — R1013 Epigastric pain: Secondary | ICD-10-CM

## 2024-02-02 DIAGNOSIS — K219 Gastro-esophageal reflux disease without esophagitis: Secondary | ICD-10-CM

## 2024-02-02 DIAGNOSIS — R140 Abdominal distension (gaseous): Secondary | ICD-10-CM

## 2024-02-03 ENCOUNTER — Other Ambulatory Visit (HOSPITAL_COMMUNITY): Payer: Self-pay

## 2024-02-03 ENCOUNTER — Telehealth: Payer: Self-pay

## 2024-02-03 NOTE — Telephone Encounter (Signed)
 Pt made aware PA approval.  Pt verbalized understanding with all questions answered.

## 2024-02-03 NOTE — Telephone Encounter (Signed)
 Pharmacy Patient Advocate Encounter   Received notification from RX Request Messages that prior authorization for Pantoprazole  Sodium 40MG  dr tablets is required/requested.   Insurance verification completed.   The patient is insured through [facility]   Prior Authorization for Pantoprazole  Sodium 40MG  dr tablets has been APPROVED from 02-03-2024 to 02-02-2025. Ran test claim, Copay is $8.12. This test claim was processed through [facility]- copay amounts may vary at other pharmacies due to pharmacy/plan contracts, or as the patient moves through the different stages of their insurance plan.   PA #/Case ID/Reference #: [id number]

## 2024-02-03 NOTE — Telephone Encounter (Signed)
 Through a [place] interpreter pt was left a message to call back.

## 2024-02-03 NOTE — Telephone Encounter (Signed)
 PA request has been Approved. New Encounter has been or will be created for follow up. For additional info see Pharmacy Prior Auth telephone encounter from 02-03-2024.

## 2024-02-13 ENCOUNTER — Other Ambulatory Visit: Payer: Self-pay | Admitting: Physician Assistant

## 2024-03-16 ENCOUNTER — Other Ambulatory Visit: Payer: Self-pay | Admitting: Physician Assistant

## 2024-03-23 ENCOUNTER — Other Ambulatory Visit: Payer: Self-pay | Admitting: Nurse Practitioner

## 2024-03-23 NOTE — Telephone Encounter (Signed)
 Requesting: MONTELUKAST  SOD 10 MG TABLET, LOSARTAN  POTASSIUM 100 MG TAB , ATORVASTATIN  40 MG TABLET  Last Visit: 05/13/2023 Next Visit: 05/12/2024 Last Refill: 05/13/2023  Please Advise

## 2024-05-12 ENCOUNTER — Ambulatory Visit: Admitting: Family Medicine
